# Patient Record
Sex: Female | Born: 1950
Health system: Southern US, Community
[De-identification: ages and names within clinical notes are randomized; demographics above are authoritative.]

## PROBLEM LIST (undated history)

## (undated) DIAGNOSIS — R112 Nausea with vomiting, unspecified: Secondary | ICD-10-CM

## (undated) DIAGNOSIS — E785 Hyperlipidemia, unspecified: Secondary | ICD-10-CM

## (undated) DIAGNOSIS — Z9889 Other specified postprocedural states: Secondary | ICD-10-CM

## (undated) DIAGNOSIS — H269 Unspecified cataract: Secondary | ICD-10-CM

## (undated) DIAGNOSIS — I10 Essential (primary) hypertension: Secondary | ICD-10-CM

## (undated) HISTORY — PX: TUBAL LIGATION: SHX77

## (undated) HISTORY — DX: Unspecified cataract: H26.9

## (undated) HISTORY — PX: BREAST LUMPECTOMY: SHX2

---

## 2002-04-01 ENCOUNTER — Other Ambulatory Visit: Admission: RE | Admit: 2002-04-01 | Discharge: 2002-04-01 | Payer: Self-pay | Admitting: Family Medicine

## 2003-06-04 ENCOUNTER — Other Ambulatory Visit: Admission: RE | Admit: 2003-06-04 | Discharge: 2003-06-04 | Payer: Self-pay | Admitting: Family Medicine

## 2014-10-28 ENCOUNTER — Ambulatory Visit (INDEPENDENT_AMBULATORY_CARE_PROVIDER_SITE_OTHER): Payer: Managed Care, Other (non HMO)

## 2014-10-28 ENCOUNTER — Other Ambulatory Visit: Payer: Self-pay | Admitting: *Deleted

## 2014-10-28 DIAGNOSIS — Z23 Encounter for immunization: Secondary | ICD-10-CM

## 2015-07-13 ENCOUNTER — Encounter: Payer: Self-pay | Admitting: Physician Assistant

## 2015-07-13 ENCOUNTER — Ambulatory Visit (INDEPENDENT_AMBULATORY_CARE_PROVIDER_SITE_OTHER): Payer: Medicare Other | Admitting: Physician Assistant

## 2015-07-13 VITALS — BP 142/70 | HR 69 | Temp 97.4°F | Wt 174.8 lb

## 2015-07-13 DIAGNOSIS — J069 Acute upper respiratory infection, unspecified: Secondary | ICD-10-CM

## 2015-07-13 MED ORDER — AMOXICILLIN-POT CLAVULANATE 875-125 MG PO TABS: 1.0000 | ORAL_TABLET | Freq: Two times a day (BID) | ORAL | Status: DC

## 2015-07-13 NOTE — Patient Instructions (Signed)
Upper Respiratory Infection, Adult An upper respiratory infection (URI) is also sometimes known as the common cold. The upper respiratory tract includes the nose, sinuses, throat, trachea, and bronchi. Bronchi are the airways leading to the lungs. Most people improve within 1 week, but symptoms can last up to 2 weeks. A residual cough may last even longer.  CAUSES Many different viruses can infect the tissues lining the upper respiratory tract. The tissues become irritated and inflamed and often become very moist. Mucus production is also common. A cold is contagious. You can easily spread the virus to others by oral contact. This includes kissing, sharing a glass, coughing, or sneezing. Touching your mouth or nose and then touching a surface, which is then touched by another person, can also spread the virus. SYMPTOMS  Symptoms typically develop 1 to 3 days after you come in contact with a cold virus. Symptoms vary from person to person. They may include:  Runny nose.  Sneezing.  Nasal congestion.  Sinus irritation.  Sore throat.  Loss of voice (laryngitis).  Cough.  Fatigue.  Muscle aches.  Loss of appetite.  Headache.  Low-grade fever. DIAGNOSIS  You might diagnose your own cold based on familiar symptoms, since most people get a cold 2 to 3 times a year. Your caregiver can confirm this based on your exam. Most importantly, your caregiver can check that your symptoms are not due to another disease such as strep throat, sinusitis, pneumonia, asthma, or epiglottitis. Blood tests, throat tests, and X-rays are not necessary to diagnose a common cold, but they may sometimes be helpful in excluding other more serious diseases. Your caregiver will decide if any further tests are required. RISKS AND COMPLICATIONS  You may be at risk for a more severe case of the common cold if you smoke cigarettes, have chronic heart disease (such as heart failure) or lung disease (such as asthma), or if  you have a weakened immune system. The very young and very old are also at risk for more serious infections. Bacterial sinusitis, middle ear infections, and bacterial pneumonia can complicate the common cold. The common cold can worsen asthma and chronic obstructive pulmonary disease (COPD). Sometimes, these complications can require emergency medical care and may be life-threatening. PREVENTION  The best way to protect against getting a cold is to practice good hygiene. Avoid oral or hand contact with people with cold symptoms. Wash your hands often if contact occurs. There is no clear evidence that vitamin C, vitamin E, echinacea, or exercise reduces the chance of developing a cold. However, it is always recommended to get plenty of rest and practice good nutrition. TREATMENT  Treatment is directed at relieving symptoms. There is no cure. Antibiotics are not effective, because the infection is caused by a virus, not by bacteria. Treatment may include:  Increased fluid intake. Sports drinks offer valuable electrolytes, sugars, and fluids.  Breathing heated mist or steam (vaporizer or shower).  Eating chicken soup or other clear broths, and maintaining good nutrition.  Getting plenty of rest.  Using gargles or lozenges for comfort.  Controlling fevers with ibuprofen or acetaminophen as directed by your caregiver.  Increasing usage of your inhaler if you have asthma. Zinc gel and zinc lozenges, taken in the first 24 hours of the common cold, can shorten the duration and lessen the severity of symptoms. Pain medicines may help with fever, muscle aches, and throat pain. A variety of non-prescription medicines are available to treat congestion and runny nose. Your caregiver   can make recommendations and may suggest nasal or lung inhalers for other symptoms.  HOME CARE INSTRUCTIONS   Only take over-the-counter or prescription medicines for pain, discomfort, or fever as directed by your  caregiver.  Use a warm mist humidifier or inhale steam from a shower to increase air moisture. This may keep secretions moist and make it easier to breathe.  Drink enough water and fluids to keep your urine clear or pale yellow.  Rest as needed.  Return to work when your temperature has returned to normal or as your caregiver advises. You may need to stay home longer to avoid infecting others. You can also use a face mask and careful hand washing to prevent spread of the virus. SEEK MEDICAL CARE IF:   After the first few days, you feel you are getting worse rather than better.  You need your caregiver's advice about medicines to control symptoms.  You develop chills, worsening shortness of breath, or brown or red sputum. These may be signs of pneumonia.  You develop yellow or brown nasal discharge or pain in the face, especially when you bend forward. These may be signs of sinusitis.  You develop a fever, swollen neck glands, pain with swallowing, or white areas in the back of your throat. These may be signs of strep throat. SEEK IMMEDIATE MEDICAL CARE IF:   You have a fever.  You develop severe or persistent headache, ear pain, sinus pain, or chest pain.  You develop wheezing, a prolonged cough, cough up blood, or have a change in your usual mucus (if you have chronic lung disease).  You develop sore muscles or a stiff neck. Document Released: 06/06/2001 Document Revised: 03/04/2012 Document Reviewed: 03/18/2014 [employer] Patient Information 2015 [employer]. This information is not intended to replace advice given to you by your health care provider. Make sure you discuss any questions you have with your health care provider.  

## 2015-07-13 NOTE — Progress Notes (Signed)
Subjective:     Patient ID: Brianna [name], female   DOB: [birth year], 64 y.o.   MRN: [id number]  HPI Pt with cough and congestion for 1 week Sx got better and then worse Cough is productive Using OTC cough med  Review of Systems  Constitutional: Negative for fever, activity change, appetite change and fatigue.  HENT: Positive for congestion, postnasal drip and sinus pressure.   Respiratory: Positive for cough and chest tightness. Negative for choking, shortness of breath and wheezing.   Cardiovascular: Negative.        Objective:   Physical Exam  Constitutional: She appears well-developed and well-nourished.  HENT:  Right Ear: External ear normal.  Left Ear: External ear normal.  Mouth/Throat: Oropharynx is clear and moist. No oropharyngeal exudate.  TM's nl bilat  Neck: Neck supple. No JVD present.  Cardiovascular: Normal rate, regular rhythm and normal heart sounds.   Pulmonary/Chest: Effort normal and breath sounds normal. No respiratory distress. She has no wheezes. She has no rales. She exhibits no tenderness.  Very tight and coarse with cough  Lymphadenopathy:    She has no cervical adenopathy.  Nursing note and vitals reviewed.      Assessment:     Acute URI    Plan:     Fluids  Rest Augmentin 875mg  bid x 10 days F/U prn

## 2015-12-03 ENCOUNTER — Encounter: Payer: Self-pay | Admitting: Pediatrics

## 2015-12-03 ENCOUNTER — Ambulatory Visit (INDEPENDENT_AMBULATORY_CARE_PROVIDER_SITE_OTHER): Payer: Medicare Other | Admitting: Pediatrics

## 2015-12-03 VITALS — BP 152/71 | HR 61 | Temp 97.5°F | Ht 65.0 in | Wt 175.0 lb

## 2015-12-03 DIAGNOSIS — J069 Acute upper respiratory infection, unspecified: Secondary | ICD-10-CM

## 2015-12-03 DIAGNOSIS — F17200 Nicotine dependence, unspecified, uncomplicated: Secondary | ICD-10-CM | POA: Diagnosis not present

## 2015-12-03 DIAGNOSIS — Z1389 Encounter for screening for other disorder: Secondary | ICD-10-CM

## 2015-12-03 DIAGNOSIS — R05 Cough: Secondary | ICD-10-CM

## 2015-12-03 DIAGNOSIS — Z23 Encounter for immunization: Secondary | ICD-10-CM | POA: Diagnosis not present

## 2015-12-03 DIAGNOSIS — R059 Cough, unspecified: Secondary | ICD-10-CM

## 2015-12-03 DIAGNOSIS — R062 Wheezing: Secondary | ICD-10-CM | POA: Diagnosis not present

## 2015-12-03 DIAGNOSIS — Z6829 Body mass index (BMI) 29.0-29.9, adult: Secondary | ICD-10-CM | POA: Insufficient documentation

## 2015-12-03 DIAGNOSIS — Z1331 Encounter for screening for depression: Secondary | ICD-10-CM

## 2015-12-03 DIAGNOSIS — E663 Overweight: Secondary | ICD-10-CM | POA: Insufficient documentation

## 2015-12-03 MED ORDER — GUAIFENESIN-CODEINE 100-10 MG/5ML PO SOLN: 5.0000 mL | Freq: Every evening | ORAL | Status: DC | PRN

## 2015-12-03 MED ORDER — SPACER/AERO CHAMBER MOUTHPIECE MISC: Status: DC

## 2015-12-03 MED ORDER — AZITHROMYCIN 250 MG PO TABS: ORAL_TABLET | ORAL | Status: DC

## 2015-12-03 MED ORDER — ALBUTEROL SULFATE HFA 108 (90 BASE) MCG/ACT IN AERS: 2.0000 | INHALATION_SPRAY | Freq: Four times a day (QID) | RESPIRATORY_TRACT | Status: DC | PRN

## 2015-12-03 NOTE — Patient Instructions (Signed)
Lots of fluids

## 2015-12-03 NOTE — Progress Notes (Addendum)
Subjective:    Patient ID: Brianna [name], female    DOB: 1951-01-21, 64 y.o.   MRN: [id number]  CC: Cough and Nasal Congestion   HPI: Brianna [name] is a 64 y.o. female presenting for Cough and Nasal Congestion  Lots of congestion, ongoing for over a week. Coughing a lot, worse at night Yesterday felt better, today a little worse No pressure in her sinuses Current smoker, about 1.5 ppd. Interested in quitting at some point, says not now. Gets more out of breath with usual activities No HA or vision changes No fevers Otherwise feeling well No weight changes    Depression screen [facility] 2/9 12/03/2015 07/13/2015  Decreased Interest 0 0  Down, Depressed, Hopeless 0 0  PHQ - 2 Score 0 0     Relevant past medical, surgical, family and social history reviewed and updated as indicated. Interim medical history since our last visit reviewed. Allergies and medications reviewed and updated.    ROS: Per HPI unless specifically indicated above  History  Smoking status  . Heavy Tobacco Smoker -- 1.50 packs/day  Smokeless tobacco  . Not on file    Past Medical History Patient Active Problem List   Diagnosis Date Noted  . Wheezing 12/03/2015  . Tobacco use disorder 12/03/2015  . BMI 29.0-29.9,adult 12/03/2015    Current Outpatient Prescriptions  Medication Sig Dispense Refill  . albuterol (PROVENTIL HFA;VENTOLIN HFA) 108 (90 BASE) MCG/ACT inhaler Inhale 2 puffs into the lungs every 6 (six) hours as needed for wheezing or shortness of breath. 1 Inhaler 0  . azithromycin (ZITHROMAX) 250 MG tablet Take 2 the first day and then one each day after. 6 tablet 0  . guaiFENesin-codeine 100-10 MG/5ML syrup Take 5 mLs by mouth at bedtime as needed for cough. 120 mL 0  . Spacer/Aero Chamber Mouthpiece MISC Please dispense one spacer for use with inhaler. 1 each 0   No current facility-administered medications for this visit.       Objective:    BP 152/71 mmHg  Pulse 61  Temp(Src) 97.5 F  (36.4 C) (Oral)  Ht 5\' 5"  (1.651 m)  Wt 175 lb (79.379 kg)  BMI 29.12 kg/m2  SpO2 96%  Wt Readings from Last 3 Encounters:  12/03/15 175 lb (79.379 kg)  07/13/15 174 lb 12.8 oz (79.289 kg)     Gen: NAD, alert, cooperative with exam, NCAT EYES: EOMI, no scleral injection or icterus ENT:  TMs dull gray b/l, OP without erythema LYMPH: no cervical LAD CV: NRRR, normal S1/S2, no murmur, distal pulses 2+ b/l Resp: faint wheezes b/l, moving air well, normal WOB Abd: +BS, soft, NTND. no guarding or organomegaly Ext: No edema, warm Neuro: Alert and oriented, strength equal b/l UE and LE, coordination grossly normal MSK: normal muscle bulk     Assessment & Plan:    Chamere was seen today for cough and nasal congestion, found to be wheezing, likely with COPD. Pt starting to feel better today, gave paper RX for azithro, fill if wheezing worsening, or not continuing to improve daily.  Diagnoses and all orders for this visit:  Wheezing [place] TID 2 puffs while coughing. Fill azithro if not continuing to improve. RTC 4 weeks for PFTs, discuss tobacco use. -     Spacer/Aero Chamber Mouthpiece MISC; Please dispense one spacer for use with inhaler. -     azithromycin (ZITHROMAX) 250 MG tablet; Take 2 the first day and then one each day after. -     albuterol (  PROVENTIL HFA;VENTOLIN HFA) 108 (90 BASE) MCG/ACT inhaler; Inhale 2 puffs into the lungs every 6 (six) hours as needed for wheezing or shortness of breath.  Coughing Take at ngiht as needed for cough -     guaiFENesin-codeine 100-10 MG/5ML syrup; Take 5 mLs by mouth at bedtime as needed for cough.  Acute URI Discussed symptomatic care  Need for prophylactic vaccination against Streptococcus pneumoniae (pneumococcus) -     Pneumococcal conjugate vaccine 13-valent  Encounter for immunization  Depression screen negative  Tobacco use disorder Pt pre-contemplative, not interested in discussing smoking cessation today. Will continue to  address at future visits.  Other orders -     Flu Vaccine QUAD 36+ mos IM  Elevated blood pressure Has been on cold medicines, RTC 4 weeks for recheck  Follow up plan: 4 weeks  Assunta Found, MD [facility] 12/03/2015, 11:15 AM

## 2015-12-03 NOTE — Addendum Note (Signed)
Addended by: Eustaquio Maize on: 12/03/2015 10:08 PM   Modules accepted: Level of Service

## 2015-12-08 ENCOUNTER — Telehealth: Payer: Self-pay

## 2015-12-08 MED ORDER — ALBUTEROL SULFATE HFA 108 (90 BASE) MCG/ACT IN AERS: 2.0000 | INHALATION_SPRAY | Freq: Four times a day (QID) | RESPIRATORY_TRACT | Status: DC | PRN

## 2015-12-08 NOTE — Telephone Encounter (Signed)
Insurance denied prior authorization for Proventil HFA Must try and fail Proair HFA or Proair Respiclick

## 2016-04-18 ENCOUNTER — Ambulatory Visit (INDEPENDENT_AMBULATORY_CARE_PROVIDER_SITE_OTHER): Payer: Medicare Other | Admitting: Family

## 2016-04-18 ENCOUNTER — Encounter: Payer: Self-pay | Admitting: Family

## 2016-04-18 VITALS — BP 170/79 | HR 72 | Temp 97.5°F | Ht 65.0 in | Wt 173.0 lb

## 2016-04-18 DIAGNOSIS — S46912A Strain of unspecified muscle, fascia and tendon at shoulder and upper arm level, left arm, initial encounter: Secondary | ICD-10-CM | POA: Diagnosis not present

## 2016-04-18 MED ORDER — NAPROXEN 500 MG PO TABS: 500.0000 mg | ORAL_TABLET | Freq: Two times a day (BID) | ORAL | Status: DC

## 2016-04-18 MED ORDER — CYCLOBENZAPRINE HCL 5 MG PO TABS: 5.0000 mg | ORAL_TABLET | Freq: Three times a day (TID) | ORAL | Status: DC | PRN

## 2016-04-18 MED ORDER — KETOROLAC TROMETHAMINE 60 MG/2ML IM SOLN
30.0000 mg | Freq: Once | INTRAMUSCULAR | Status: AC
  Administered 2016-04-18: 30 mg via INTRAMUSCULAR

## 2016-04-18 MED ORDER — METHYLPREDNISOLONE ACETATE 80 MG/ML IJ SUSP
80.0000 mg | Freq: Once | INTRAMUSCULAR | Status: AC
  Administered 2016-04-18: 80 mg via INTRAMUSCULAR

## 2016-04-18 NOTE — Progress Notes (Signed)
   Subjective:    Patient ID: Brianna [name], female    DOB: [birth year], 65 y.o.   MRN: [id number]  Shoulder Pain  The pain is present in the left shoulder and left arm. This is a new problem. The current episode started 1 to 4 weeks ago. There has been no history of extremity trauma. The problem occurs constantly. The problem has been unchanged. The quality of the pain is described as aching. The pain is at a severity of 7/10. The pain is moderate. Associated symptoms include numbness, stiffness and tingling. Pertinent negatives include no inability to bear weight. The symptoms are aggravated by activity. She has tried acetaminophen for the symptoms. The treatment provided mild relief. Family history does not include gout.      Review of Systems  Musculoskeletal: Positive for stiffness.  Neurological: Positive for tingling and numbness.  All other systems reviewed and are negative.      Objective:   Physical Exam  Constitutional: She is oriented to person, place, and time. She appears well-developed and well-nourished. No distress.  HENT:  Head: Normocephalic and atraumatic.  Eyes: Pupils are equal, round, and reactive to light.  Neck: Normal range of motion. Neck supple. No thyromegaly present.  Cardiovascular: Normal rate, regular rhythm, normal heart sounds and intact distal pulses.   No murmur heard. Pulmonary/Chest: Effort normal and breath sounds normal. No respiratory distress. She has no wheezes.  Abdominal: Soft. Bowel sounds are normal. She exhibits no distension. There is no tenderness.  Musculoskeletal: She exhibits no edema or tenderness.  Limited ROM of lifting her arm greater than 45 degrees related to pain    Neurological: She is alert and oriented to person, place, and time. No cranial nerve deficit.  Skin: Skin is warm and dry.  Psychiatric: She has a normal mood and affect. Her behavior is normal. Judgment and thought content normal.  Vitals reviewed.   Temp(Src)  97.5 F (36.4 C) (Oral)  Ht 5\' 5"  (1.651 m)  Wt 173 lb (78.472 kg)  BMI 28.79 kg/m2       Assessment & Plan:  1. Left shoulder strain, initial encounter -Rest -Ice or heat as needed -Sedation precautions discussed -RTO in 2 weeks to recheck  - methylPREDNISolone acetate (DEPO-MEDROL) injection 80 mg; Inject 1 mL (80 mg total) into the muscle once. - ketorolac (TORADOL) injection 30 mg; Inject 1 mL (30 mg total) into the muscle once. - naproxen (NAPROSYN) 500 MG tablet; Take 1 tablet (500 mg total) by mouth 2 (two) times daily with a meal.  Dispense: 60 tablet; Refill: 1 - cyclobenzaprine (FLEXERIL) 5 MG tablet; Take 1 tablet (5 mg total) by mouth 3 (three) times daily as needed for muscle spasms.  Dispense: 30 tablet; Refill: 0  Evelina Dun, FNP

## 2016-04-18 NOTE — Patient Instructions (Signed)
Shoulder Sprain °A shoulder sprain is a partial or complete tear in one of the tough, fiber-like tissues (ligaments) in the shoulder. The ligaments in the shoulder help to hold the shoulder in place. °CAUSES °This condition may be caused by: °· A fall. °· A hit to the shoulder. °· A twist of the arm. °RISK FACTORS °This condition is more likely to develop in: °· People who play sports. °· People who have problems with balance or coordination. °SYMPTOMS °Symptoms of this condition include: °· Pain when moving the shoulder. °· Limited ability to move the shoulder. °· Swelling and tenderness on top of the shoulder. °· Warmth in the shoulder. °· A change in the shape of the shoulder. °· Redness or bruising on the shoulder. °DIAGNOSIS °This condition is diagnosed with a physical exam. During the exam, you may be asked to do simple exercises with your shoulder. You may also have imaging tests, such as X-rays, MRI, or a CT scan. These tests can show how severe the sprain is. °TREATMENT °This condition may be treated with: °· Rest. °· Pain medicine. °· Ice. °· A sling or brace. This is used to keep the arm still while the shoulder is healing. °· Physical therapy or rehabilitation exercises. These help to improve the range of motion and strength of the shoulder. °· Surgery (rare). Surgery may be needed if the sprain caused a joint to become unstable. Surgery may also be needed to reduce pain. °Some people may develop ongoing shoulder pain or lose some range of motion in the shoulder. However, most people do not develop long-term problems. °HOME CARE INSTRUCTIONS °· Rest. °· Take over-the-counter and prescription medicines only as told by your health care provider. °· If directed, apply ice to the area: °¨ Put ice in a plastic bag. °¨ Place a towel between your skin and the bag. °¨ Leave the ice on for 20 minutes, 2-3 times per day. °· If you were given a shoulder sling or brace: °¨ Wear it as told. °¨ Remove it to shower or  bathe. °¨ Move your arm only as much as told by your health care provider, but keep your hand moving to prevent swelling. °· If you were shown how to do any exercises, do them as told by your health care provider. °· Keep all follow-up visits as told by your health care provider. This is important. °SEEK MEDICAL CARE IF: °· Your pain gets worse. °· Your pain is not relieved with medicines. °· You have increased redness or swelling. °SEEK IMMEDIATE MEDICAL CARE IF: °· You have a fever. °· You cannot move your arm or shoulder. °· You develop numbness or tingling in your arms, hands, or fingers. °  °This information is not intended to replace advice given to you by your health care provider. Make sure you discuss any questions you have with your health care provider. °  °Document Released: 04/29/2009 Document Revised: 09/01/2015 Document Reviewed: 04/05/2015 °[employer] Interactive Patient Education ©2016 [employer] ° °

## 2016-05-16 ENCOUNTER — Encounter: Payer: Self-pay | Admitting: Family

## 2016-05-16 ENCOUNTER — Ambulatory Visit (INDEPENDENT_AMBULATORY_CARE_PROVIDER_SITE_OTHER): Payer: Medicare Other

## 2016-05-16 ENCOUNTER — Encounter (INDEPENDENT_AMBULATORY_CARE_PROVIDER_SITE_OTHER): Payer: Self-pay

## 2016-05-16 ENCOUNTER — Ambulatory Visit (INDEPENDENT_AMBULATORY_CARE_PROVIDER_SITE_OTHER): Payer: Medicare Other | Admitting: Family

## 2016-05-16 VITALS — BP 136/60 | HR 71 | Temp 97.4°F | Ht 65.0 in | Wt 173.0 lb

## 2016-05-16 DIAGNOSIS — IMO0001 Reserved for inherently not codable concepts without codable children: Secondary | ICD-10-CM

## 2016-05-16 DIAGNOSIS — Z6829 Body mass index (BMI) 29.0-29.9, adult: Secondary | ICD-10-CM

## 2016-05-16 DIAGNOSIS — S46912D Strain of unspecified muscle, fascia and tendon at shoulder and upper arm level, left arm, subsequent encounter: Secondary | ICD-10-CM | POA: Diagnosis not present

## 2016-05-16 DIAGNOSIS — F17200 Nicotine dependence, unspecified, uncomplicated: Secondary | ICD-10-CM

## 2016-05-16 DIAGNOSIS — Z780 Asymptomatic menopausal state: Secondary | ICD-10-CM

## 2016-05-16 DIAGNOSIS — Z13220 Encounter for screening for lipoid disorders: Secondary | ICD-10-CM

## 2016-05-16 DIAGNOSIS — R030 Elevated blood-pressure reading, without diagnosis of hypertension: Secondary | ICD-10-CM | POA: Diagnosis not present

## 2016-05-16 DIAGNOSIS — Z136 Encounter for screening for cardiovascular disorders: Secondary | ICD-10-CM

## 2016-05-16 DIAGNOSIS — Z1239 Encounter for other screening for malignant neoplasm of breast: Secondary | ICD-10-CM | POA: Diagnosis not present

## 2016-05-16 DIAGNOSIS — Z1211 Encounter for screening for malignant neoplasm of colon: Secondary | ICD-10-CM | POA: Diagnosis not present

## 2016-05-16 DIAGNOSIS — Z1159 Encounter for screening for other viral diseases: Secondary | ICD-10-CM | POA: Diagnosis not present

## 2016-05-16 NOTE — Patient Instructions (Signed)
Health Maintenance, Female Adopting a healthy lifestyle and getting preventive care can go a long way to promote health and wellness. Talk with your health care provider about what schedule of regular examinations is right for you. This is a good chance for you to check in with your provider about disease prevention and staying healthy. In between checkups, there are plenty of things you can do on your own. Experts have done a lot of research about which lifestyle changes and preventive measures are most likely to keep you healthy. Ask your health care provider for more information. WEIGHT AND DIET  Eat a healthy diet  Be sure to include plenty of vegetables, fruits, low-fat dairy products, and lean protein.  Do not eat a lot of foods high in solid fats, added sugars, or salt.  Get regular exercise. This is one of the most important things you can do for your health.  Most adults should exercise for at least 150 minutes each week. The exercise should increase your heart rate and make you sweat (moderate-intensity exercise).  Most adults should also do strengthening exercises at least twice a week. This is in addition to the moderate-intensity exercise.  Maintain a healthy weight  Body mass index (BMI) is a measurement that can be used to identify possible weight problems. It estimates body fat based on height and weight. Your health care provider can help determine your BMI and help you achieve or maintain a healthy weight.  For females 20 years of age and older:   A BMI below 18.5 is considered underweight.  A BMI of 18.5 to 24.9 is normal.  A BMI of 25 to 29.9 is considered overweight.  A BMI of 30 and above is considered obese.  Watch levels of cholesterol and blood lipids  You should start having your blood tested for lipids and cholesterol at 66 years of age, then have this test every 5 years.  You may need to have your cholesterol levels checked more often if:  Your lipid  or cholesterol levels are high.  You are older than 66 years of age.  You are at high risk for heart disease.  CANCER SCREENING   Lung Cancer  Lung cancer screening is recommended for adults 55-80 years old who are at high risk for lung cancer because of a history of smoking.  A yearly low-dose CT scan of the lungs is recommended for people who:  Currently smoke.  Have quit within the past 15 years.  Have at least a 30-pack-year history of smoking. A pack year is smoking an average of one pack of cigarettes a day for 1 year.  Yearly screening should continue until it has been 15 years since you quit.  Yearly screening should stop if you develop a health problem that would prevent you from having lung cancer treatment.  Breast Cancer  Practice breast self-awareness. This means understanding how your breasts normally appear and feel.  It also means doing regular breast self-exams. Let your health care provider know about any changes, no matter how small.  If you are in your 20s or 30s, you should have a clinical breast exam (CBE) by a health care provider every 1-3 years as part of a regular health exam.  If you are 40 or older, have a CBE every year. Also consider having a breast X-ray (mammogram) every year.  If you have a family history of breast cancer, talk to your health care provider about genetic screening.  If you   are at high risk for breast cancer, talk to your health care provider about having an MRI and a mammogram every year.  Breast cancer gene (BRCA) assessment is recommended for women who have family members with BRCA-related cancers. BRCA-related cancers include:  Breast.  Ovarian.  Tubal.  Peritoneal cancers.  Results of the assessment will determine the need for genetic counseling and BRCA1 and BRCA2 testing. Cervical Cancer Your health care provider may recommend that you be screened regularly for cancer of the pelvic organs (ovaries, uterus, and  vagina). This screening involves a pelvic examination, including checking for microscopic changes to the surface of your cervix (Pap test). You may be encouraged to have this screening done every 3 years, beginning at age 21.  For women ages 30-65, health care providers may recommend pelvic exams and Pap testing every 3 years, or they may recommend the Pap and pelvic exam, combined with testing for human papilloma virus (HPV), every 5 years. Some types of HPV increase your risk of cervical cancer. Testing for HPV may also be done on women of any age with unclear Pap test results.  Other health care providers may not recommend any screening for nonpregnant women who are considered low risk for pelvic cancer and who do not have symptoms. Ask your health care provider if a screening pelvic exam is right for you.  If you have had past treatment for cervical cancer or a condition that could lead to cancer, you need Pap tests and screening for cancer for at least 20 years after your treatment. If Pap tests have been discontinued, your risk factors (such as having a new sexual partner) need to be reassessed to determine if screening should resume. Some women have medical problems that increase the chance of getting cervical cancer. In these cases, your health care provider may recommend more frequent screening and Pap tests. Colorectal Cancer  This type of cancer can be detected and often prevented.  Routine colorectal cancer screening usually begins at 66 years of age and continues through 66 years of age.  Your health care provider may recommend screening at an earlier age if you have risk factors for colon cancer.  Your health care provider may also recommend using home test kits to check for hidden blood in the stool.  A small camera at the end of a tube can be used to examine your colon directly (sigmoidoscopy or colonoscopy). This is done to check for the earliest forms of colorectal  cancer.  Routine screening usually begins at age 50.  Direct examination of the colon should be repeated every 5-10 years through 66 years of age. However, you may need to be screened more often if early forms of precancerous polyps or small growths are found. Skin Cancer  Check your skin from head to toe regularly.  Tell your health care provider about any new moles or changes in moles, especially if there is a change in a mole's shape or color.  Also tell your health care provider if you have a mole that is larger than the size of a pencil eraser.  Always use sunscreen. Apply sunscreen liberally and repeatedly throughout the day.  Protect yourself by wearing long sleeves, pants, a wide-brimmed hat, and sunglasses whenever you are outside. HEART DISEASE, DIABETES, AND HIGH BLOOD PRESSURE   High blood pressure causes heart disease and increases the risk of stroke. High blood pressure is more likely to develop in:  People who have blood pressure in the high end   of the normal range (130-139/85-89 mm Hg).  People who are overweight or obese.  People who are African American.  If you are 38-23 years of age, have your blood pressure checked every 3-5 years. If you are 61 years of age or older, have your blood pressure checked every year. You should have your blood pressure measured twice--once when you are at a hospital or clinic, and once when you are not at a hospital or clinic. Record the average of the two measurements. To check your blood pressure when you are not at a hospital or clinic, you can use:  An automated blood pressure machine at a pharmacy.  A home blood pressure monitor.  If you are between 45 years and 39 years old, ask your health care provider if you should take aspirin to prevent strokes.  Have regular diabetes screenings. This involves taking a blood sample to check your fasting blood sugar level.  If you are at a normal weight and have a low risk for diabetes,  have this test once every three years after 66 years of age.  If you are overweight and have a high risk for diabetes, consider being tested at a younger age or more often. PREVENTING INFECTION  Hepatitis B  If you have a higher risk for hepatitis B, you should be screened for this virus. You are considered at high risk for hepatitis B if:  You were born in a country where hepatitis B is common. Ask your health care provider which countries are considered high risk.  Your parents were born in a high-risk country, and you have not been immunized against hepatitis B (hepatitis B vaccine).  You have HIV or AIDS.  You use needles to inject street drugs.  You live with someone who has hepatitis B.  You have had sex with someone who has hepatitis B.  You get hemodialysis treatment.  You take certain medicines for conditions, including cancer, organ transplantation, and autoimmune conditions. Hepatitis C  Blood testing is recommended for:  Everyone born from [birth year] through 1966.  Anyone with known risk factors for hepatitis C. Sexually transmitted infections (STIs)  You should be screened for sexually transmitted infections (STIs) including gonorrhea and chlamydia if:  You are sexually active and are younger than 66 years of age.  You are older than 66 years of age and your health care provider tells you that you are at risk for this type of infection.  Your sexual activity has changed since you were last screened and you are at an increased risk for chlamydia or gonorrhea. Ask your health care provider if you are at risk.  If you do not have HIV, but are at risk, it may be recommended that you take a prescription medicine daily to prevent HIV infection. This is called pre-exposure prophylaxis (PrEP). You are considered at risk if:  You are sexually active and do not regularly use condoms or know the HIV status of your partner(s).  You take drugs by injection.  You are sexually  active with a partner who has HIV. Talk with your health care provider about whether you are at high risk of being infected with HIV. If you choose to begin PrEP, you should first be tested for HIV. You should then be tested every 3 months for as long as you are taking PrEP.  PREGNANCY   If you are premenopausal and you may become pregnant, ask your health care provider about preconception counseling.  If you may  become pregnant, take 400 to 800 micrograms (mcg) of folic acid every day.  If you want to prevent pregnancy, talk to your health care provider about birth control (contraception). OSTEOPOROSIS AND MENOPAUSE   Osteoporosis is a disease in which the bones lose minerals and strength with aging. This can result in serious bone fractures. Your risk for osteoporosis can be identified using a bone density scan.  If you are 61 years of age or older, or if you are at risk for osteoporosis and fractures, ask your health care provider if you should be screened.  Ask your health care provider whether you should take a calcium or vitamin D supplement to lower your risk for osteoporosis.  Menopause may have certain physical symptoms and risks.  Hormone replacement therapy may reduce some of these symptoms and risks. Talk to your health care provider about whether hormone replacement therapy is right for you.  HOME CARE INSTRUCTIONS   Schedule regular health, dental, and eye exams.  Stay current with your immunizations.   Do not use any tobacco products including cigarettes, chewing tobacco, or electronic cigarettes.  If you are pregnant, do not drink alcohol.  If you are breastfeeding, limit how much and how often you drink alcohol.  Limit alcohol intake to no more than 1 drink per day for nonpregnant women. One drink equals 12 ounces of beer, 5 ounces of wine, or 1 ounces of hard liquor.  Do not use street drugs.  Do not share needles.  Ask your health care provider for help if  you need support or information about quitting drugs.  Tell your health care provider if you often feel depressed.  Tell your health care provider if you have ever been abused or do not feel safe at home.   This information is not intended to replace advice given to you by your health care provider. Make sure you discuss any questions you have with your health care provider.   Document Released: 06/26/2011 Document Revised: 01/01/2015 Document Reviewed: 11/12/2013 Elsevier Interactive Patient Education [employer].

## 2016-05-16 NOTE — Progress Notes (Signed)
   Subjective:    Patient ID: Brianna [name], female    DOB: [birth year], 65 y.o.   MRN: [id number]  HPI PT presents to the office today to recheck left shoulder strain and elevated BP. PT was stared on naproxen and flexeril and states the pain has greatly improved. PT states she has intermittent pain of 3 out 10 and has much better ROM. PT's BP is WNL at this time. Pt states it has been years since her last physical and is behind on all of her health maintenance.     Review of Systems  Constitutional: Negative.   HENT: Negative.   Eyes: Negative.   Respiratory: Negative.  Negative for shortness of breath.   Cardiovascular: Negative.  Negative for palpitations.  Gastrointestinal: Negative.   Endocrine: Negative.   Genitourinary: Negative.   Musculoskeletal: Negative.   Neurological: Negative.  Negative for headaches.  Hematological: Negative.   Psychiatric/Behavioral: Negative.   All other systems reviewed and are negative.      Objective:   Physical Exam  Constitutional: She is oriented to person, place, and time. She appears well-developed and well-nourished. No distress.  HENT:  Head: Normocephalic and atraumatic.  Right Ear: External ear normal.  Mouth/Throat: Oropharynx is clear and moist.  Eyes: Pupils are equal, round, and reactive to light.  Neck: Normal range of motion. Neck supple. No thyromegaly present.  Cardiovascular: Normal rate, regular rhythm, normal heart sounds and intact distal pulses.   No murmur heard. Pulmonary/Chest: Effort normal and breath sounds normal. No respiratory distress. She has no wheezes.  Hoarse voice, diminished breath sounds   Abdominal: Soft. Bowel sounds are normal. She exhibits no distension. There is no tenderness.  Musculoskeletal: Normal range of motion. She exhibits no edema or tenderness.  Neurological: She is alert and oriented to person, place, and time. She has normal reflexes. No cranial nerve deficit.  Skin: Skin is warm and dry.   Psychiatric: She has a normal mood and affect. Her behavior is normal. Judgment and thought content normal.  Vitals reviewed.     BP 136/60 mmHg  Pulse 71  Temp(Src) 97.4 F (36.3 C) (Oral)  Ht '5\' 5"'$  (1.651 m)  Wt 173 lb (78.472 kg)  BMI 28.79 kg/m2     Assessment & Plan:  1. Left shoulder strain, subsequent encounter - CMP14+EGFR  2. BMI 29.0-29.9,adult - CMP14+EGFR  3. Tobacco use disorder - CMP14+EGFR  4. Breast cancer screening - [facility] - CMP14+EGFR  5. Encounter for cholesteral screening for cardiovascular disease - CMP14+EGFR - Lipid panel  6. Need for hepatitis C screening test - CMP14+EGFR - Hepatitis C Antibody  7. Colon cancer screening - Fecal occult blood, imunochemical; Future - CMP14+EGFR  8. Post-menopausal - DG [facility] DEXA - CMP14+EGFR   Continue all meds Labs pending Health Maintenance reviewed Diet and exercise encouraged RTO 6 months  Brianna Dun, FNP

## 2016-05-17 LAB — CMP14+EGFR
ALBUMIN: 4.4 g/dL (ref 3.6–4.8)
ALK PHOS: 84 IU/L (ref 39–117)
ALT: 20 IU/L (ref 0–32)
AST: 17 IU/L (ref 0–40)
Albumin/Globulin Ratio: 2 (ref 1.2–2.2)
BUN/Creatinine Ratio: 22 (ref 12–28)
BUN: 16 mg/dL (ref 8–27)
Bilirubin Total: 0.5 mg/dL (ref 0.0–1.2)
CO2: 26 mmol/L (ref 18–29)
CREATININE: 0.72 mg/dL (ref 0.57–1.00)
Calcium: 9.8 mg/dL (ref 8.7–10.3)
Chloride: 99 mmol/L (ref 96–106)
GFR calc Af Amer: 101 mL/min/{1.73_m2} (ref >59)
GFR calc non Af Amer: 88 mL/min/{1.73_m2} (ref >59)
GLUCOSE: 102 mg/dL — AB (ref 65–99)
Globulin, Total: 2.2 g/dL (ref 1.5–4.5)
Potassium: 5.4 mmol/L — ABNORMAL HIGH (ref 3.5–5.2)
Sodium: 140 mmol/L (ref 134–144)
Total Protein: 6.6 g/dL (ref 6.0–8.5)

## 2016-05-17 LAB — HEPATITIS C ANTIBODY: Hep C Virus Ab: <0.1 s/co ratio (ref 0.0–0.9)

## 2016-05-17 LAB — LIPID PANEL
CHOLESTEROL TOTAL: 193 mg/dL (ref 100–199)
Chol/HDL Ratio: 2.1 ratio units (ref 0.0–4.4)
HDL: 90 mg/dL (ref >39)
LDL CALC: 81 mg/dL (ref 0–99)
TRIGLYCERIDES: 112 mg/dL (ref 0–149)
VLDL CHOLESTEROL CAL: 22 mg/dL (ref 5–40)

## 2016-05-25 ENCOUNTER — Ambulatory Visit: Payer: Medicare Other | Admitting: Pharmacist

## 2016-06-07 ENCOUNTER — Ambulatory Visit (INDEPENDENT_AMBULATORY_CARE_PROVIDER_SITE_OTHER): Payer: Medicare Other | Admitting: Pharmacist

## 2016-06-07 ENCOUNTER — Encounter: Payer: Self-pay | Admitting: Pharmacist

## 2016-06-07 VITALS — BP 134/68 | HR 70 | Ht 65.0 in | Wt 173.0 lb

## 2016-06-07 DIAGNOSIS — M8580 Other specified disorders of bone density and structure, unspecified site: Secondary | ICD-10-CM | POA: Diagnosis not present

## 2016-06-07 DIAGNOSIS — Z1239 Encounter for other screening for malignant neoplasm of breast: Secondary | ICD-10-CM | POA: Diagnosis not present

## 2016-06-07 MED ORDER — CALCIUM CARBONATE-VITAMIN D 600-400 MG-UNIT PO TABS: 1.0000 | ORAL_TABLET | Freq: Every day | ORAL | Status: AC

## 2016-06-07 NOTE — Patient Instructions (Signed)
Fall Prevention in the Home  Falls can cause injuries and can affect people from all age groups. There are many simple things that you can do to make your home safe and to help prevent falls. WHAT CAN I DO ON THE OUTSIDE OF MY HOME?  Regularly repair the edges of walkways and driveways and fix any cracks.  Remove high doorway thresholds.  Trim any shrubbery on the main path into your home.  Use bright outdoor lighting.  Clear walkways of debris and clutter, including tools and rocks.  Regularly check that handrails are securely fastened and in good repair. Both sides of any steps should have handrails.  Install guardrails along the edges of any raised decks or porches.  Have leaves, snow, and ice cleared regularly.  Use sand or salt on walkways during winter months.  In the garage, clean up any spills right away, including grease or oil spills. WHAT CAN I DO IN THE BATHROOM?  Use night lights.  Install grab bars by the toilet and in the tub and shower. Do not use towel bars as grab bars.  Use non-skid mats or decals on the floor of the tub or shower.  If you need to sit down while you are in the shower, use a plastic, non-slip stool..  Keep the floor dry. Immediately clean up any water that spills on the floor.  Remove soap buildup in the tub or shower on a regular basis.  Attach bath mats securely with double-sided non-slip rug tape.  Remove throw rugs and other tripping hazards from the floor. WHAT CAN I DO IN THE BEDROOM?  Use night lights.  Make sure that a bedside light is easy to reach.  Do not use oversized bedding that drapes onto the floor.  Have a firm chair that has side arms to use for getting dressed.  Remove throw rugs and other tripping hazards from the floor. WHAT CAN I DO IN THE KITCHEN?   Clean up any spills right away.  Avoid walking on wet floors.  Place frequently used items in easy-to-reach places.  If you need to reach for something  above you, use a sturdy step stool that has a grab bar.  Keep electrical cables out of the way.  Do not use floor polish or wax that makes floors slippery. If you have to use wax, make sure that it is non-skid floor wax.  Remove throw rugs and other tripping hazards from the floor. WHAT CAN I DO IN THE STAIRWAYS?  Do not leave any items on the stairs.  Make sure that there are handrails on both sides of the stairs. Fix handrails that are broken or loose. Make sure that handrails are as long as the stairways.  Check any carpeting to make sure that it is firmly attached to the stairs. Fix any carpet that is loose or worn.  Avoid having throw rugs at the top or bottom of stairways, or secure the rugs with carpet tape to prevent them from moving.  Make sure that you have a light switch at the top of the stairs and the bottom of the stairs. If you do not have them, have them installed. WHAT ARE SOME OTHER FALL PREVENTION TIPS?  Wear closed-toe shoes that fit well and support your feet. Wear shoes that have rubber soles or low heels.  When you use a stepladder, make sure that it is completely opened and that the sides are firmly locked. Have someone hold the ladder while you   are using it. Do not climb a closed stepladder.  Add color or contrast paint or tape to grab bars and handrails in your home. Place contrasting color strips on the first and last steps.  Use mobility aids as needed, such as canes, walkers, scooters, and crutches.  Turn on lights if it is dark. Replace any light bulbs that burn out.  Set up furniture so that there are clear paths. Keep the furniture in the same spot.  Fix any uneven floor surfaces.  Choose a carpet design that does not hide the edge of steps of a stairway.  Be aware of any and all pets.  Review your medicines with your healthcare provider. Some medicines can cause dizziness or changes in blood pressure, which increase your risk of falling. Talk  with your health care provider about other ways that you can decrease your risk of falls. This may include working with a physical therapist or trainer to improve your strength, balance, and endurance.   This information is not intended to replace advice given to you by your health care provider. Make sure you discuss any questions you have with your health care provider.   Document Released: 12/01/2002 Document Revised: 04/27/2015 Document Reviewed: 01/15/2015 Elsevier Interactive Patient Education 2016 [employer]   Calcium & Vitamin D: The Facts  Why is calcium and vitamin D consumption important? Calcium: . Most Americans do not consume adequate amounts of calcium! Calcium is required for proper muscle function, nerve communication, bone support, and many other functions in the body.  . The body uses bones as a source of calcium. Bones 'remodel' themselves continuously - the body constantly breaks bone down to release calcium and rebuilds bones by replacing calcium in the bone later.  . As we get older, the rate of bone breakdown occurs faster than bone rebuilding which could lead to osteopenia, osteoporosis, and possible fractures.   Vitamin D: . People naturally make vitamin D in the body when sunlight hits the skin and triggers a process that leads to vitamin D production. This natural vitamin D production requires about 10-15 minutes of sun exposure on the hands, arms, and face at least 2-3 times per week. However, due to decreased sun exposure and the use of sunscreen, most people will need to get additional vitamin D from foods or supplements. Your doctor can measure your body's vitamin D level through a simple blood test to determine your daily vitamin D needs.  . Vitamin D is used to help the body absorb calcium, maintain bone health, help the immune system, and reduce inflammation. It also plays a role in muscle performance, balance and risk of falling.  . Vitamin D deficiency can  lead to osteomalacia or softening of the bones, bone pain, and muscle weakness.   The recommended daily allowance of Calcium and Vitamin D varies for different age groups. Age group Calcium (mg) Vitamin D (IU)  Females and Males: Age 19-50 1000 mg 600 IU  Females: Age 51- 70 1200 mg 600 IU  Males: Age 51-70 1000 mg 600 IU  Females and Males: Age 71+ 1200 mg 800 IU  Pregnant/lactating Females age 19-50 1000 mg 600 IU   How much Calcium do you get in your diet? Calcium Intake # of servings per day  Total calcium (mg)  Skim milk, 2% milk (1 cup) _________ x 300 mg   Yogurt (1 small container) _________ x 200 mg   Cheese (1oz) _________ x 200 mg   Cottage Cheese (  1 cup)             ________ x 150 mg   Almond milk (1 cup) _________ x 450 mg   Fortified Orange Juice (1 cup) _________ x 300 mg   Broccoli or spinach ( 1 cup) _________ x 100 mg   Salmon (3 oz) _________ x 150 mg    Almonds (1/4 cup) _______ x 90 mg      How do we get Calcium and Vitamin D in our diet? Calcium: . Obtaining calcium from the diet is the most preferred way to reach the recommended daily goal. If this goal is not reached through diet, calcium supplements are available.  . Calcium is found in many foods including: dairy products, dark leafy vegetables (like broccoli, kale, and spinach), fish, and fortified products like juices and cereals.  . The food label will have a %DV (percent daily value) listed showing the amount of calcium per serving. To determine the total mg per serving, simply replace the % with zero (0).  For example, Almond Breeze almond milk contains 45% DV of calcium or 450mg per 1 cup.  . You can increase the amount of calcium in your diet by using more calcium products in your daily meals. Use yogurt and fruit to make smoothies or use yogurt to top baked potatoes or make whipped potatoes. Sprinkle low fat cheese onto salads or into egg white omelets. You can even add non-fat dry milk powder (300mg  calcium per 1/3 cup) to hot cereals, meat loaf, soups, or potatoes.  . Calcium supplements come in many forms including tablets, chewables, and gummies. Be sure to read the label to determine the correct number of tablets per serving and whether or not to take the supplement with food.  . Calcium carbonate products (Oscal, Caltrate, and Viactiv) are generally better absorbed when taken with food while calcium citrate products like Citracal can be taken with or without food.  . The body can only absorb about 600 mg of calcium at one time. It is recommended to take calcium supplements in small amounts several times per day.  However, taking it all at once is better than not taking it at all. . Increasing your intake of calcium is essential for bone health, but may also lead to some side effects like constipation, increased gas, bloating or abdominal cramping. To help reduce these side effects, start with 1 tablet per day and slowly increase your intake of the supplement to the recommended doses. It is also recommended that you drink plenty of water each day. Vitamin D: . Very few foods naturally contain vitamin D. However, it is found in saltwater fish (like tuna, salmon and mackerel), beef liver, egg yolks, cheese and vitamin D fortified foods (like yogurt, cereals, orange juice and milk) . The amount of vitamin D in each food or product is listed as %DV on the product label. To determine the total amount of vitamin D per serving, drop the % sign and multiply the number by 4. For example, 1 cup of Almond Breeze almond milk contains 25% DV vitamin D or 100 IU per serving (25 x 4 =100). . Vitamin D is also found in multivitamins and supplements and may be listed as ergocalciferol (vitamin D2) or cholecalciferol (vitamin D3). Each of these forms of vitamin D are equivalent and the daily recommended intake will vary based on your age and the vitamin D levels in your body. Follow your doctor's recommendation for  vitamin D   intake.        

## 2016-06-07 NOTE — Progress Notes (Signed)
Patient ID: Brianna [name], female   DOB: February 12, 1951, 65 y.o.   MRN: [id number]  [facility] Current Height: Height: 5\' 5"  (165.1 cm)      Max Lifetime Height:  5' 5.5" Current Weight: Weight: 173 lb (78.472 kg)       Ethnicity:Caucasian  BP: BP: 134/68 mmHg     HR:  Pulse Rate: 70      HPI: Does pt already have a diagnosis of:  Osteopenia?  No Osteoporosis?  No  Back Pain?  No       Kyphosis?  No Prior fracture?  No Med(s) for Osteoporosis/Osteopenia:  NONE Med(s) previously tried for Osteoporosis/Osteopenia:  NONE                                                             PMH: Age at menopause:  83's Hysterectomy?  No Oophorectomy?  No HRT? No Steroid Use?  No Thyroid med?  No History of cancer?  No History of digestive disorders (ie Crohn's)?  No Current or previous eating disorders?  No Last Vitamin D Result:  Never checked Last GFR Result:  88 (05/16/2016)   FH/SH: Family history of osteoporosis?  Yes - sister and mother Parent with history of hip fracture?  No Family history of breast cancer?  Yes - sister Exercise?  Yes - walks dog Smoking?  Yes - 1 and 1/2 ppd for 46 years Alcohol?  Yes occ use    Calcium Assessment Calcium Intake  # of servings/day  Calcium mg  Milk (8 oz) 0.5  x  300  = 150mg   Yogurt (4 oz) 0 x  200 = 0  Cheese (1 oz) 1 x  200 = 200mg   Other Calcium sources   250mg   Ca supplement 0 = 0   Estimated calcium intake per day 600mg     DEXA Results Date of Test T-Score for AP Spine L1-L4 T-Score for Total Left Hip T-Score of Neck of Left Hip T-Score for Total Right Hip T-Score for Neck of Right Hip  05/16/2016 -0.3 -0.5 -2.2 -0.7 -1.9                        FRAX 10 year estimate: Total FX risk:  12%  (consider medication if >/= 20%) Hip FX risk:  3.5%  (consider medication if >/= 3%)  Assessment: Osteopenia with high risk for fracture per FRAX  Recommendations: 1.  Discussed pharmacotherapy option.  Patient declined.   2.   recommend calcium 1200mg  daily through supplementation or diet.  3.  recommend weight bearing exercise - 30 minutes at least 4 days per week.   4.  Counseled and educated about fall risk and prevention. 5.  Discussed smoking cessation - discussed pharmacotherapy and patient declined.  Discussed trigger avoidiance and reducing amount she smokes.   6.  Mammogram order sent (patient goes to [facility])  Recheck DEXA:  2 years  Time spent counseling patient:  35 minutes

## 2016-09-23 ENCOUNTER — Ambulatory Visit (INDEPENDENT_AMBULATORY_CARE_PROVIDER_SITE_OTHER): Payer: Medicare Other

## 2016-09-23 DIAGNOSIS — Z23 Encounter for immunization: Secondary | ICD-10-CM | POA: Diagnosis not present

## 2016-10-02 ENCOUNTER — Ambulatory Visit (INDEPENDENT_AMBULATORY_CARE_PROVIDER_SITE_OTHER): Payer: Medicare Other | Admitting: Family Medicine

## 2016-10-02 ENCOUNTER — Encounter: Payer: Self-pay | Admitting: Family Medicine

## 2016-10-02 VITALS — BP 155/66 | HR 68 | Temp 97.9°F | Ht 65.0 in | Wt 176.1 lb

## 2016-10-02 DIAGNOSIS — J441 Chronic obstructive pulmonary disease with (acute) exacerbation: Secondary | ICD-10-CM | POA: Diagnosis not present

## 2016-10-02 MED ORDER — AMOXICILLIN-POT CLAVULANATE 875-125 MG PO TABS: 1.0000 | ORAL_TABLET | Freq: Two times a day (BID) | ORAL | 0 refills | Status: DC

## 2016-10-02 MED ORDER — HYDROCODONE-HOMATROPINE 5-1.5 MG/5ML PO SYRP: 5.0000 mL | ORAL_SOLUTION | Freq: Four times a day (QID) | ORAL | 0 refills | Status: DC | PRN

## 2016-10-02 NOTE — Progress Notes (Signed)
Subjective:  Patient ID: Brianna [name], female    DOB: 17-Oct-1951  Age: 65 y.o. MRN: [id number]  CC: Cough (pt here today c/o cough and it keeps her awake at night)   HPI Brianna [name] presents for  Increasing cough since onset 8 days ago. This occurred one day after getting a high-dose flu shot. Her symptoms are primarily upper respiratory congestion that has gotten better. This included some sinus pressure. She's not had rhinorrhea. She has a cough that feels loose somewhat that she is not able to get anything up from it. She has a baseline shortness of breath due to her long-term history of smoking. As reported below 69 pack years.   History Karn has a past medical history of Cataract.   She has a past surgical history that includes Breast lumpectomy (Left, 90's) and Tubal ligation.   Her family history includes Cancer in her brother, father, sister, and sister; Diabetes in her brother, brother, father, and sister; Heart disease in her mother; Hypertension in her mother; Osteoporosis in her mother and sister.She reports that she has been smoking.  She has a 69.00 pack-year smoking history. She has never used smokeless tobacco. She reports that she drinks alcohol. She reports that she does not use drugs.    ROS Review of Systems  Constitutional: Negative for activity change, appetite change, chills and fever.  HENT: Positive for congestion, postnasal drip, rhinorrhea and sinus pressure. Negative for ear discharge, ear pain, hearing loss, nosebleeds, sneezing and trouble swallowing.   Respiratory: Negative for chest tightness and shortness of breath.   Cardiovascular: Negative for chest pain and palpitations.  Skin: Negative for rash.    Objective:  BP (!) 155/66   Pulse 68   Temp 97.9 F (36.6 C) (Oral)   Ht 5\' 5"  (1.651 m)   Wt 176 lb 2 oz (79.9 kg)   BMI 29.31 kg/m   BP Readings from Last 3 Encounters:  10/02/16 (!) 155/66  06/07/16 134/68  05/16/16 136/60    Wt Readings  from Last 3 Encounters:  10/02/16 176 lb 2 oz (79.9 kg)  06/07/16 173 lb (78.5 kg)  05/16/16 173 lb (78.5 kg)     Physical Exam  Constitutional: She appears well-developed and well-nourished.  HENT:  Head: Normocephalic and atraumatic.  Right Ear: Tympanic membrane and external ear normal. No decreased hearing is noted.  Left Ear: Tympanic membrane and external ear normal. No decreased hearing is noted.  Nose: Mucosal edema present. Right sinus exhibits no frontal sinus tenderness. Left sinus exhibits no frontal sinus tenderness.  Mouth/Throat: No oropharyngeal exudate or posterior oropharyngeal erythema.  Neck: No Brudzinski's sign noted.  Pulmonary/Chest: Breath sounds normal. No respiratory distress.  Lymphadenopathy:       Head (right side): No preauricular adenopathy present.       Head (left side): No preauricular adenopathy present.       Right cervical: No superficial cervical adenopathy present.      Left cervical: No superficial cervical adenopathy present.     Lab Results  Component Value Date   GLUCOSE 102 (H) 05/16/2016   CHOL 193 05/16/2016   TRIG 112 05/16/2016   HDL 90 05/16/2016   LDLCALC 81 05/16/2016   ALT 20 05/16/2016   AST 17 05/16/2016   NA 140 05/16/2016   K 5.4 (H) 05/16/2016   CL 99 05/16/2016   CREATININE 0.72 05/16/2016   BUN 16 05/16/2016   CO2 26 05/16/2016    No results found.  Assessment & Plan:   Avalia was seen today for cough.  Diagnoses and all orders for this visit:  Acute exacerbation of chronic obstructive pulmonary disease (COPD) ([place])  Other orders -     amoxicillin-clavulanate (AUGMENTIN) 875-125 MG tablet; Take 1 tablet by mouth 2 (two) times daily. Take all of this medication -     HYDROcodone-homatropine (HYCODAN) 5-1.5 MG/5ML syrup; Take 5 mLs by mouth every 6 (six) hours as needed for cough.     I have discontinued Ms. Clear's naproxen and cyclobenzaprine. I am also having her start on amoxicillin-clavulanate and  HYDROcodone-homatropine. Additionally, I am having her maintain her Calcium Carbonate-Vitamin D.  Meds ordered this encounter  Medications  . amoxicillin-clavulanate (AUGMENTIN) 875-125 MG tablet    Sig: Take 1 tablet by mouth 2 (two) times daily. Take all of this medication    Dispense:  20 tablet    Refill:  0  . HYDROcodone-homatropine (HYCODAN) 5-1.5 MG/5ML syrup    Sig: Take 5 mLs by mouth every 6 (six) hours as needed for cough.    Dispense:  120 mL    Refill:  0     Follow-up: Return if symptoms worsen or fail to improve.  Claretta Fraise, M.D.

## 2018-01-29 ENCOUNTER — Ambulatory Visit (INDEPENDENT_AMBULATORY_CARE_PROVIDER_SITE_OTHER): Payer: Medicare Other | Admitting: Physician Assistant

## 2018-01-29 ENCOUNTER — Encounter: Payer: Self-pay | Admitting: Physician Assistant

## 2018-01-29 VITALS — BP 157/70 | HR 69 | Temp 97.4°F | Ht 65.0 in | Wt 172.8 lb

## 2018-01-29 DIAGNOSIS — J0110 Acute frontal sinusitis, unspecified: Secondary | ICD-10-CM | POA: Diagnosis not present

## 2018-01-29 MED ORDER — HYDROCODONE-HOMATROPINE 5-1.5 MG/5ML PO SYRP: 5.0000 mL | ORAL_SOLUTION | Freq: Four times a day (QID) | ORAL | 0 refills | Status: DC | PRN

## 2018-01-29 MED ORDER — AMOXICILLIN-POT CLAVULANATE 875-125 MG PO TABS: 1.0000 | ORAL_TABLET | Freq: Two times a day (BID) | ORAL | 0 refills | Status: DC

## 2018-01-29 NOTE — Progress Notes (Signed)
BP (!) 157/70   Pulse 69   Temp (!) 97.4 F (36.3 C) (Oral)   Ht 5\' 5"  (1.651 m)   Wt 172 lb 12.8 oz (78.4 kg)   BMI 28.76 kg/m    Subjective:    Patient ID: Brianna [name], female    DOB: 1951-11-26, 67 y.o.   MRN: [id number]  HPI: Marticia Reifschneider is a 67 y.o. female presenting on 01/29/2018 for head congestion and chest congestion  This patient has had many days of sinus headache and postnasal drainage. There is copious drainage at times. Denies any fever at this time. There has been a history of sinus infections in the past.  No history of sinus surgery. There is cough at night. It has become more prevalent in recent days.   Past Medical History:  Diagnosis Date  . Cataract    Relevant past medical, surgical, family and social history reviewed and updated as indicated. Interim medical history since our last visit reviewed. Allergies and medications reviewed and updated. DATA REVIEWED: CHART IN EPIC  Family History reviewed for pertinent findings.  Review of Systems  Constitutional: Positive for chills and fatigue. Negative for activity change, appetite change and fever.  HENT: Positive for congestion, postnasal drip, sinus pressure, sinus pain and sore throat.   Eyes: Negative.   Respiratory: Positive for cough. Negative for wheezing.   Cardiovascular: Negative.  Negative for chest pain, palpitations and leg swelling.  Gastrointestinal: Negative.   Genitourinary: Negative.   Musculoskeletal: Negative.   Skin: Negative.   Neurological: Positive for headaches.    Allergies as of 01/29/2018   No Known Allergies     Medication List        Accurate as of 01/29/18 12:26 PM. Always use your most recent med list.          amoxicillin-clavulanate 875-125 MG tablet Commonly known as:  AUGMENTIN Take 1 tablet by mouth 2 (two) times daily. Take all of this medication   Calcium Carbonate-Vitamin D 600-400 MG-UNIT tablet Commonly known as:  CALCIUM 600+D Take 1 tablet by mouth  daily.   HYDROcodone-homatropine 5-1.5 MG/5ML syrup Commonly known as:  HYCODAN Take 5 mLs by mouth every 6 (six) hours as needed for cough.          Objective:    BP (!) 157/70   Pulse 69   Temp (!) 97.4 F (36.3 C) (Oral)   Ht 5\' 5"  (1.651 m)   Wt 172 lb 12.8 oz (78.4 kg)   BMI 28.76 kg/m   No Known Allergies  Wt Readings from Last 3 Encounters:  01/29/18 172 lb 12.8 oz (78.4 kg)  10/02/16 176 lb 2 oz (79.9 kg)  06/07/16 173 lb (78.5 kg)    Physical Exam  Constitutional: She is oriented to person, place, and time. She appears well-developed and well-nourished.  HENT:  Head: Normocephalic and atraumatic.  Right Ear: Tympanic membrane and external ear normal. No middle ear effusion.  Left Ear: Tympanic membrane and external ear normal.  No middle ear effusion.  Nose: Mucosal edema and rhinorrhea present. Right sinus exhibits no maxillary sinus tenderness. Left sinus exhibits no maxillary sinus tenderness.  Mouth/Throat: Uvula is midline. Posterior oropharyngeal erythema present.  Eyes: Conjunctivae and EOM are normal. Pupils are equal, round, and reactive to light. Right eye exhibits no discharge. Left eye exhibits no discharge.  Neck: Normal range of motion.  Cardiovascular: Normal rate, regular rhythm and normal heart sounds.  Pulmonary/Chest: Effort normal and breath sounds normal. No  respiratory distress. She has no wheezes.  Abdominal: Soft.  Lymphadenopathy:    She has no cervical adenopathy.  Neurological: She is alert and oriented to person, place, and time.  Skin: Skin is warm and dry.  Psychiatric: She has a normal mood and affect.        Assessment & Plan:   1. Acute non-recurrent frontal sinusitis - amoxicillin-clavulanate (AUGMENTIN) 875-125 MG tablet; Take 1 tablet by mouth 2 (two) times daily. Take all of this medication  Dispense: 20 tablet; Refill: 0 - HYDROcodone-homatropine (HYCODAN) 5-1.5 MG/5ML syrup; Take 5 mLs by mouth every 6 (six) hours as  needed for cough.  Dispense: 120 mL; Refill: 0   Continue all other maintenance medications as listed above.  Follow up plan: No Follow-up on file.  Educational handout given for [place] PA-C [place] [address]  [place], [place] [address] [phone]   01/29/2018, 12:26 PM

## 2018-02-20 ENCOUNTER — Ambulatory Visit (INDEPENDENT_AMBULATORY_CARE_PROVIDER_SITE_OTHER): Payer: Medicare Other

## 2018-02-20 DIAGNOSIS — Z23 Encounter for immunization: Secondary | ICD-10-CM | POA: Diagnosis not present

## 2018-02-20 NOTE — Progress Notes (Signed)
Pt given Preumovax and flu vaccine Tolerated well

## 2018-02-20 NOTE — Addendum Note (Signed)
Addended by: Marin Olp on: 02/20/2018 12:35 PM   Modules accepted: Orders

## 2018-04-18 DIAGNOSIS — H52223 Regular astigmatism, bilateral: Secondary | ICD-10-CM | POA: Diagnosis not present

## 2018-04-18 DIAGNOSIS — H524 Presbyopia: Secondary | ICD-10-CM | POA: Diagnosis not present

## 2018-04-18 DIAGNOSIS — H25813 Combined forms of age-related cataract, bilateral: Secondary | ICD-10-CM | POA: Diagnosis not present

## 2018-04-18 DIAGNOSIS — H5203 Hypermetropia, bilateral: Secondary | ICD-10-CM | POA: Diagnosis not present

## 2018-06-04 ENCOUNTER — Encounter: Payer: Self-pay | Admitting: Nurse Practitioner

## 2018-06-04 ENCOUNTER — Ambulatory Visit (INDEPENDENT_AMBULATORY_CARE_PROVIDER_SITE_OTHER): Payer: Medicare Other

## 2018-06-04 ENCOUNTER — Ambulatory Visit (INDEPENDENT_AMBULATORY_CARE_PROVIDER_SITE_OTHER): Payer: Medicare Other | Admitting: Nurse Practitioner

## 2018-06-04 VITALS — BP 142/62 | HR 80 | Temp 98.3°F | Ht 65.0 in | Wt 164.0 lb

## 2018-06-04 DIAGNOSIS — J40 Bronchitis, not specified as acute or chronic: Secondary | ICD-10-CM | POA: Diagnosis not present

## 2018-06-04 DIAGNOSIS — J0100 Acute maxillary sinusitis, unspecified: Secondary | ICD-10-CM | POA: Diagnosis not present

## 2018-06-04 DIAGNOSIS — R05 Cough: Secondary | ICD-10-CM | POA: Diagnosis not present

## 2018-06-04 DIAGNOSIS — R059 Cough, unspecified: Secondary | ICD-10-CM

## 2018-06-04 MED ORDER — ALBUTEROL SULFATE HFA 108 (90 BASE) MCG/ACT IN AERS: 2.0000 | INHALATION_SPRAY | Freq: Four times a day (QID) | RESPIRATORY_TRACT | 0 refills | Status: DC | PRN

## 2018-06-04 MED ORDER — HYDROCODONE-HOMATROPINE 5-1.5 MG/5ML PO SYRP: 5.0000 mL | ORAL_SOLUTION | Freq: Four times a day (QID) | ORAL | 0 refills | Status: DC | PRN

## 2018-06-04 MED ORDER — AZITHROMYCIN 250 MG PO TABS: ORAL_TABLET | ORAL | 0 refills | Status: DC

## 2018-06-04 NOTE — Progress Notes (Signed)
Subjective:    Patient ID: Brianna [name], female    DOB: [birth year], 67 y.o.   MRN: [id number]   Chief Complaint: Sinus Problem; Cough; and Shortness of Breath   HPI Patient come sin today c/o of cough and congestion. Started with congestion over the past week and then turned into cough with some SOB. She has taken 2 doses of tylenol but that is all.    Review of Systems  Constitutional: Positive for appetite change (decreased) and fatigue. Negative for chills and fever.  HENT: Positive for congestion and ear pain. Negative for sore throat and trouble swallowing.   Respiratory: Positive for cough and shortness of breath.   Cardiovascular: Negative.   Gastrointestinal: Negative.   Genitourinary: Negative.   Neurological: Negative for headaches.  Psychiatric/Behavioral: Negative.   All other systems reviewed and are negative.      Objective:   Physical Exam  Constitutional: She is oriented to person, place, and time. She appears well-developed and well-nourished. She appears distressed.  HENT:  Head: Normocephalic.  Mouth/Throat: Oropharynx is clear and moist.  Eyes: Pupils are equal, round, and reactive to light. EOM are normal.  Neck: Normal range of motion. Neck supple.  Cardiovascular: Normal rate and normal heart sounds.  Pulmonary/Chest: No tachypnea. No respiratory distress. She has decreased breath sounds in the left upper field. She has wheezes in the right upper field, the right middle field, the right lower field, the left upper field, the left middle field and the left lower field. She has rales in the left upper field. She exhibits no tenderness.  Abdominal: Soft. Bowel sounds are normal.  Neurological: She is alert and oriented to person, place, and time.  Skin: Skin is warm and dry. Capillary refill takes 2 to 3 seconds.  Psychiatric: She has a normal mood and affect. Her behavior is normal.   BP (!) 142/62   Pulse 80   Temp 98.3 F (36.8 C) (Oral)   Ht 5\' 5"   (1.651 m)   Wt 164 lb (74.4 kg)   SpO2 94%   BMI 27.29 kg/m   Chest x ray- bronchitic changes- sent stat to radiology-Preliminary reading by Ronnald Collum, FNP  [facility]       Assessment & Plan:  Brianna [name] in today with chief complaint of Sinus Problem; Cough; and Shortness of Breath   1. Cough - DG Chest 2 View; Future  2. Acute maxillary sinusitis, recurrence not specified 1. Take meds as prescribed 2. Use a cool mist humidifier especially during the winter months and when heat has been humid. 3. Use saline nose sprays frequently 4. Saline irrigations of the nose can be very helpful if [name] frequently.  * 4X daily for 1 week*  * Use of a nettie pot can be helpful with this. Follow directions with this* 5. Drink plenty of fluids 6. Keep thermostat turn down low 7.For any cough or congestion  Use plain Mucinex- regular strength or max strength is fine   * Children- consult with Pharmacist for dosing 8. For fever or aces or pains- take tylenol or ibuprofen appropriate for age and weight.  * for fevers greater than 101 orally you may alternate ibuprofen and tylenol every  3 hours.    - azithromycin (ZITHROMAX Z-PAK) 250 MG tablet; As directed  Dispense: 6 tablet; Refill: 0  3. Bronchitis Really need to try to stop smoking - HYDROcodone-homatropine (HYCODAN) 5-1.5 MG/5ML syrup; Take 5 mLs by mouth every 6 (six) hours as needed for  cough.  Dispense: 120 mL; Refill: 0   Brianna Hassell Done, FNP

## 2018-06-04 NOTE — Addendum Note (Signed)
Addended by: Chevis Pretty on: 06/04/2018 10:49 AM   Modules accepted: Orders

## 2018-06-04 NOTE — Patient Instructions (Signed)

## 2018-09-10 ENCOUNTER — Ambulatory Visit: Payer: Medicare Other

## 2019-02-25 ENCOUNTER — Ambulatory Visit (INDEPENDENT_AMBULATORY_CARE_PROVIDER_SITE_OTHER): Payer: Medicare Other | Admitting: *Deleted

## 2019-02-25 ENCOUNTER — Other Ambulatory Visit: Payer: Self-pay | Admitting: *Deleted

## 2019-02-25 ENCOUNTER — Encounter: Payer: Self-pay | Admitting: *Deleted

## 2019-02-25 VITALS — BP 145/66 | HR 73 | Ht 65.0 in | Wt 165.0 lb

## 2019-02-25 DIAGNOSIS — Z0000 Encounter for general adult medical examination without abnormal findings: Secondary | ICD-10-CM | POA: Diagnosis not present

## 2019-02-25 DIAGNOSIS — Z1212 Encounter for screening for malignant neoplasm of rectum: Secondary | ICD-10-CM

## 2019-02-25 DIAGNOSIS — Z1211 Encounter for screening for malignant neoplasm of colon: Secondary | ICD-10-CM

## 2019-02-25 MED ORDER — ALBUTEROL SULFATE HFA 108 (90 BASE) MCG/ACT IN AERS: 2.0000 | INHALATION_SPRAY | Freq: Four times a day (QID) | RESPIRATORY_TRACT | 0 refills | Status: DC | PRN

## 2019-02-25 NOTE — Progress Notes (Addendum)
Subjective:   Brianna [name] is a 68 y.o. female who presents for a Initial Medicare Annual Wellness Visit.  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  She enjoys reading, and babysitting her granddaughter.  She lives at home with her husband and dog.  She has 2 children and 1 grandchild.    Patient Care Team: Sharion Balloon, FNP as PCP - General (Family Medicine)  Hospitalizations, surgeries, and ER visits in previous 12 months No hospitalizations, ER visits, or surgeries this past year.   Review of Systems    Patient reports that her overall health is worse compared to last year due to increased shortness of breath  Cardiac Risk Factors include: advanced age (>9men, >23 women);sedentary lifestyle;smoking/ tobacco exposure  Musculoskeletal- hand pain L>R  All other systems negative       Current Medications (verified) Outpatient Encounter Medications as of 02/25/2019  Medication Sig  . Calcium Carbonate-Vitamin D (CALCIUM 600+D) 600-400 MG-UNIT tablet Take 1 tablet by mouth daily.  . [DISCONTINUED] albuterol (PROVENTIL HFA;VENTOLIN HFA) 108 (90 Base) MCG/ACT inhaler Inhale 2 puffs into the lungs every 6 (six) hours as needed for wheezing or shortness of breath.  Marland Kitchen HYDROcodone-homatropine (HYCODAN) 5-1.5 MG/5ML syrup Take 5 mLs by mouth every 6 (six) hours as needed for cough. (Patient not taking: Reported on 02/25/2019)  . [DISCONTINUED] azithromycin (ZITHROMAX Z-PAK) 250 MG tablet As directed   No facility-administered encounter medications on file as of 02/25/2019.     Allergies (verified) Patient has no known allergies.   History: Past Medical History:  Diagnosis Date  . Cataract    Past Surgical History:  Procedure Laterality Date  . BREAST LUMPECTOMY Left 90's  . TUBAL LIGATION     Family History  Problem Relation Age of Onset  . Heart disease Mother   . Osteoporosis Mother   . Hypertension Mother   . Cancer Father        lung  . Diabetes  Father   . Cancer Sister        brain  . Diabetes Sister   . Cancer Brother        brain  . Osteoporosis Sister   . Cancer Sister        breast  . Diabetes Brother   . Diabetes Brother    Social History   Socioeconomic History  . Marital status: Married    Spouse name: Not on file  . Number of children: 2  . Years of education: Not on file  . Highest education level: Bachelor's degree (e.g., BA, AB, BS)  Occupational History  . Occupation: Retired    Fish farm manager: [employer]    Comment: [place]  Social Needs  . Financial resource strain: Not hard at all  . Food insecurity:    Worry: Never true    Inability: Never true  . Transportation needs:    Medical: No    Non-medical: No  Tobacco Use  . Smoking status: Heavy Tobacco Smoker    Packs/day: 1.50    Years: 46.00    Pack years: 69.00  . Smokeless tobacco: Never Used  Substance and Sexual Activity  . Alcohol use: Yes    Comment: occasional  . Drug use: No  . Sexual activity: Not Currently  Lifestyle  . Physical activity:    Days per week: 3 days    Minutes per session: 30 min  . Stress: Only a little  Relationships  . Social  connections:    Talks on phone: More than three times a week    Gets together: More than three times a week    Attends religious service: Never    Active member of club or organization: No    Attends meetings of clubs or organizations: Never    Relationship status: Married  Other Topics Concern  . Not on file  Social History Narrative  . Not on file     Clinical Intake:     Pain Score: 5                   Activities of Daily Living In your present state of health, do you have any difficulty performing the following activities: 02/25/2019  Hearing? N  Vision? N  Difficulty concentrating or making decisions? N  Walking or climbing stairs? Y  Comment Due to shortness of breath  Dressing or bathing? N  Doing errands, shopping? N  Preparing Food and eating ?  N  Using the Toilet? N  In the past six months, have you accidently leaked urine? Y  Comment With coughing or sneezing  Do you have problems with loss of bowel control? N  Managing your Medications? N  Managing your Finances? N  Housekeeping or managing your Housekeeping? N  Some recent data might be hidden     Exercise Current Exercise Habits: Walks with her dog 3-4 days per week for 30 minutes, Exercise limited by: respiratory conditions(s).  Diet Consumes 3 meals a day and 0 snacks a day.  The patient feels that she mostly follow a Regular diet.  Diet History  Patient states she has access to all the food she needs.  Recommended diet of mostly non-starchy vegetables, fruits, lean proteins, and whole grains.   Depression Screen PHQ 2/9 Scores 02/25/2019 06/04/2018 01/29/2018 10/02/2016 05/16/2016 12/03/2015 07/13/2015  PHQ - 2 Score 0 0 0 0 0 0 0     Fall Risk Fall Risk  02/25/2019 06/04/2018 01/29/2018 10/02/2016 05/16/2016  Falls in the past year? 0 No No No No     Objective:    Today's Vitals   02/25/19 1408 02/25/19 1451  BP: (!) 176/80 (!) 145/66  Pulse: 71 73  Weight: 165 lb (74.8 kg)   Height: 5\' 5"  (1.651 m)   PainSc: 5    PainLoc: Hand    Body mass index is 27.46 kg/m.  Advanced Directives 02/25/2019 05/16/2016  Does Patient Have a Medical Advance Directive? No No  Would patient like information on creating a medical advance directive? Yes (MAU/Ambulatory/Procedural Areas - Information given) Yes - Educational materials given    Hearing/Vision  No hearing or vision deficits noted during visit.  Cognitive Function: MMSE - Mini Mental State Exam 02/25/2019  Orientation to time 5  Orientation to Place 5  Registration 3  Attention/ Calculation 5  Recall 3  Language- name 2 objects 2  Language- repeat 1  Language- follow 3 step command 3  Language- read & follow direction 1  Write a sentence 1  Copy design 1  Total score 30          Immunizations and  Health Maintenance Immunization History  Administered Date(s) Administered  . Influenza Split 09/24/2013  . Influenza, High Dose Seasonal PF 09/23/2016, 02/20/2018  . Influenza,inj,Quad PF,6+ Mos 10/28/2014, 12/03/2015  . Influenza,inj,quad, With Preservative 01/06/2019  . Pneumococcal Conjugate-13 12/03/2015  . Pneumococcal Polysaccharide-23 02/20/2018  . Tdap 10/28/2014   Health Maintenance Due  Topic Date Due  .  COLONOSCOPY  12/25/2008  . MAMMOGRAM  06/07/2018   Health Maintenance  Topic Date Due  . COLONOSCOPY  12/25/2008  . MAMMOGRAM  06/07/2018  . TETANUS/TDAP  10/28/2024  . INFLUENZA VACCINE  Completed  . DEXA SCAN  Completed  . Hepatitis C Screening  Completed  . PNA vac Low Risk Adult  Completed  Patient agreeable to completing cologuard.  Test ordered. Scheduled for mammogram 03/04/2019.        Assessment:   This is a routine wellness examination for Brianna [name].    Plan:    Goals    . Weight (lb) < 155 lb (70.3 kg) (pt-stated)     Decrease portion sizes, and fried foods.         Health Maintenance & Additional Screening Recommendations  Smoking cessation counseling Advanced directives: has NO advanced directive  - add't info requested. Referral to SW: no  Lung: Low Dose CT Chest recommended if Age 47-80 years, 30 pack-year currently smoking OR have quit w/in 15years. Patient does qualify. Hepatitis C Screening recommended: completed 05/16/2016   Today's Orders Orders Placed This Encounter  Procedures  . Cologuard    Keep f/u with Sharion Balloon, FNP and any other specialty appointments you may have Continue current medications Move carefully to avoid falls.  Aim for at least 150 minutes of moderate activity a week.  Read or work on puzzles daily Stay connected with friends and family  I have personally reviewed and noted the following in the patient's chart:   . Medical and social history . Use of alcohol, tobacco or illicit drugs  . Current  medications and supplements . Functional ability and status . Nutritional status . Physical activity . Advanced directives . List of other physicians . Hospitalizations, surgeries, and ER visits in previous 12 months . Vitals . Screenings to include cognitive, depression, and falls . Referrals and appointments  In addition, I have reviewed and discussed with patient certain preventive protocols, quality metrics, and best practice recommendations. A written personalized care plan for preventive services as well as general preventive health recommendations were provided to patient.     Nolberto Hanlon, RN  02/25/2019    I have reviewed and agree with the above AWV documentation.   Evelina Dun, FNP

## 2019-02-25 NOTE — Patient Instructions (Addendum)
You set a goal of losing 10 lbs.  To work on your goal, you plan to reduce service sizes and avoid fried foods.  Please review the information given on Advance Directives.  If you complete the paperwork, please bring a copy to our office to be filed in your medical record.    Cologuard  Your provider has prescribed Cologuard, an easy-to-use, noninvasive test for colon cancer screening, based on the latest advances in stool DNA science.   Here's what will happen next:  1. You may receive a call or email from [employer] to confirm your mailing address and insurance information 2. Your kit will be shipped directly to you 3. You collect your stool sample in the privacy of your own home. Please follow the instructions that come with the kit 4. You return the kit via [facility] shipping or pick-up, in the same box it arrived in 5. You should receive a call with the results once they are available. If you do not receive a call, please contact our office at [phone]  Insurance Coverage  Cologuard is covered by Medicare and most major insurers Cologuard is covered by Medicare and Medicare Advantage with no co-pay or deductible for eligible patients ages 67-85. Nationwide, more than 94% of Cologuard patients have no out-of-pocket cost for screening. . Based on the [place] should be covered by most private insurers with no co-pay or deductible for eligible patients (ages 17-75; at average risk for colon cancer; without symptoms). Currently, ~74% of Cologuard patients 45-49 have had no out-of-pocket cost for screening.  . Many national and regional payers have begun paying for CRC screening at 74. [employer] continues to work with payers to expand coverage and access for patients ages 57-49.   Only your healthcare insurance provider can confirm how Cologuard will be covered for you. If you have questions about coverage, you can contact your insurance company  directly or ask the specialists at [employer] to do that for you. A Customer Support Specialist can be reached at [phone].    Patient Support Screening for colon cancer is very important to your good health, so if you have any questions at all, please call [facility] Customer Support Specialists at [phone]. They are available 24 hours a day, 6 days a week. An instructional video is available to view online at [email or web address]   *Information and graphics obtained from [email or web address]  You have been scheduled for your mammogram on 03/04/2019 at 2:30 pm on the mobile unit here at [facility].    You have been scheduled for an appointment with Evelina Dun, FNP on 03/10/2019 at 8:40 am.   Thank you for coming in for you Annual Wellness Visit today!  Preventive Care 20 Years and Older, Female Preventive care refers to lifestyle choices and visits with your health care provider that can promote health and wellness. What does preventive care include?  A yearly physical exam. This is also called an annual well check.  Dental exams once or twice a year.  Routine eye exams. Ask your health care provider how often you should have your eyes checked.  Personal lifestyle choices, including: ? Daily care of your teeth and gums. ? Regular physical activity. ? Eating a healthy diet. ? Avoiding tobacco and drug use. ? Limiting alcohol use. ? Practicing safe sex. ? Taking low-dose aspirin every day. ? Taking vitamin and mineral supplements as recommended by your health care provider. What happens during  an annual well check? The services and screenings done by your health care provider during your annual well check will depend on your age, overall health, lifestyle risk factors, and family history of disease. Counseling Your health care provider may ask you questions about your:  Alcohol use.  Tobacco use.  Drug use.  Emotional  well-being.  Home and relationship well-being.  Sexual activity.  Eating habits.  History of falls.  Memory and ability to understand (cognition).  Work and work Statistician.  Reproductive health.  Screening You may have the following tests or measurements:  Height, weight, and BMI.  Blood pressure.  Lipid and cholesterol levels. These may be checked every 5 years, or more frequently if you are over 24 years old.  Skin check.  Lung cancer screening. You may have this screening every year starting at age 70 if you have a 30-pack-year history of smoking and currently smoke or have quit within the past 15 years.  Colorectal cancer screening. All adults should have this screening starting at age 40 and continuing until age 32. You will have tests every 1-10 years, depending on your results and the type of screening test. People at increased risk should start screening at an earlier age. Screening tests may include: ? Guaiac-based fecal occult blood testing. ? Fecal immunochemical test (FIT). ? Stool DNA test. ? Virtual colonoscopy. ? Sigmoidoscopy. During this test, a flexible tube with a tiny camera (sigmoidoscope) is used to examine your rectum and lower colon. The sigmoidoscope is inserted through your anus into your rectum and lower colon. ? Colonoscopy. During this test, a long, thin, flexible tube with a tiny camera (colonoscope) is used to examine your entire colon and rectum.  Hepatitis C blood test.  Hepatitis B blood test.  Sexually transmitted disease (STD) testing.  Diabetes screening. This is done by checking your blood sugar (glucose) after you have not eaten for a while (fasting). You may have this done every 1-3 years.  Bone density scan. This is done to screen for osteoporosis. You may have this done starting at age 45.  Mammogram. This may be done every 1-2 years. Talk to your health care provider about how often you should have regular mammograms. Talk  with your health care provider about your test results, treatment options, and if necessary, the need for more tests. Vaccines Your health care provider may recommend certain vaccines, such as:  Influenza vaccine. This is recommended every year.  Tetanus, diphtheria, and acellular pertussis (Tdap, Td) vaccine. You may need a Td booster every 10 years.  Varicella vaccine. You may need this if you have not been vaccinated.  Zoster vaccine. You may need this after age 67.  Measles, mumps, and rubella (MMR) vaccine. You may need at least one dose of MMR if you were born in 1957 or later. You may also need a second dose.  Pneumococcal 13-valent conjugate (PCV13) vaccine. One dose is recommended after age 76.  Pneumococcal polysaccharide (PPSV23) vaccine. One dose is recommended after age 76.  Meningococcal vaccine. You may need this if you have certain conditions.  Hepatitis A vaccine. You may need this if you have certain conditions or if you travel or work in places where you may be exposed to hepatitis A.  Hepatitis B vaccine. You may need this if you have certain conditions or if you travel or work in places where you may be exposed to hepatitis B.  Haemophilus influenzae type b (Hib) vaccine. You may need this if  you have certain conditions. Talk to your health care provider about which screenings and vaccines you need and how often you need them. This information is not intended to replace advice given to you by your health care provider. Make sure you discuss any questions you have with your health care provider. Document Released: 01/07/2016 Document Revised: 01/31/2018 Document Reviewed: 10/12/2015 Elsevier Interactive Patient Education  2019 [employer] with Quitting Smoking  Quitting smoking is a physical and mental challenge. You will face cravings, withdrawal symptoms, and temptation. Before quitting, work with your health care provider to make a plan that can  help you cope. Preparation can help you quit and keep you from giving in. How can I cope with cravings? Cravings usually last for 5-10 minutes. If you get through it, the craving will pass. Consider taking the following actions to help you cope with cravings:  Keep your mouth busy: ? Chew sugar-free gum. ? Suck on hard candies or a straw. ? Brush your teeth.  Keep your hands and body busy: ? Immediately change to a different activity when you feel a craving. ? Squeeze or play with a ball. ? Do an activity or a hobby, like making bead jewelry, practicing needlepoint, or working with wood. ? Mix up your normal routine. ? Take a short exercise break. Go for a quick walk or run up and down stairs. ? Spend time in public places where smoking is not allowed.  Focus on doing something kind or helpful for someone else.  Call a friend or family member to talk during a craving.  Join a support group.  Call a quit line, such as 1-800-QUIT-NOW.  Talk with your health care provider about medicines that might help you cope with cravings and make quitting easier for you. How can I deal with withdrawal symptoms? Your body may experience negative effects as it tries to get used to not having nicotine in the system. These effects are called withdrawal symptoms. They may include:  Feeling hungrier than normal.  Trouble concentrating.  Irritability.  Trouble sleeping.  Feeling depressed.  Restlessness and agitation.  Craving a cigarette. To manage withdrawal symptoms:  Avoid places, people, and activities that trigger your cravings.  Remember why you want to quit.  Get plenty of sleep.  Avoid coffee and other caffeinated drinks. These may worsen some of your symptoms. How can I handle social situations? Social situations can be difficult when you are quitting smoking, especially in the first few weeks. To manage this, you can:  Avoid parties, bars, and other social situations where  people might be smoking.  Avoid alcohol.  Leave right away if you have the urge to smoke.  Explain to your family and friends that you are quitting smoking. Ask for understanding and support.  Plan activities with friends or family where smoking is not an option. What are some ways I can cope with stress? Wanting to smoke may cause stress, and stress can make you want to smoke. Find ways to manage your stress. Relaxation techniques can help. For example:  Breathe slowly and deeply, in through your nose and out through your mouth.  Listen to soothing, relaxing music.  Talk with a family member or friend about your stress.  Light a candle.  Soak in a bath or take a shower.  Think about a peaceful place. What are some ways I can prevent weight gain? Be aware that many people gain weight after they quit smoking. However, not  everyone does. To keep from gaining weight, have a plan in place before you quit and stick to the plan after you quit. Your plan should include:  Having healthy snacks. When you have a craving, it may help to: ? Eat plain popcorn, crunchy carrots, celery, or other cut vegetables. ? Chew sugar-free gum.  Changing how you eat: ? Eat small portion sizes at meals. ? Eat 4-6 small meals throughout the day instead of 1-2 large meals a day. ? Be mindful when you eat. Do not watch television or do other things that might distract you as you eat.  Exercising regularly: ? Make time to exercise each day. If you do not have time for a long workout, do short bouts of exercise for 5-10 minutes several times a day. ? Do some form of strengthening exercise, like weight lifting, and some form of aerobic exercise, like running or swimming.  Drinking plenty of water or other low-calorie or no-calorie drinks. Drink 6-8 glasses of water daily, or as much as instructed by your health care provider. Summary  Quitting smoking is a physical and mental challenge. You will face  cravings, withdrawal symptoms, and temptation to smoke again. Preparation can help you as you go through these challenges.  You can cope with cravings by keeping your mouth busy (such as by chewing gum), keeping your body and hands busy, and making calls to family, friends, or a helpline for people who want to quit smoking.  You can cope with withdrawal symptoms by avoiding places where people smoke, avoiding drinks with caffeine, and getting plenty of rest.  Ask your health care provider about the different ways to prevent weight gain, avoid stress, and handle social situations. This information is not intended to replace advice given to you by your health care provider. Make sure you discuss any questions you have with your health care provider. Document Released: 12/08/2016 Document Revised: 12/08/2016 Document Reviewed: 12/08/2016 Elsevier Interactive Patient Education  2019 [employer].

## 2019-03-04 DIAGNOSIS — Z1231 Encounter for screening mammogram for malignant neoplasm of breast: Secondary | ICD-10-CM | POA: Diagnosis not present

## 2019-03-04 LAB — HM MAMMOGRAPHY

## 2019-03-10 ENCOUNTER — Ambulatory Visit (INDEPENDENT_AMBULATORY_CARE_PROVIDER_SITE_OTHER): Payer: Medicare Other | Admitting: Family

## 2019-03-10 ENCOUNTER — Encounter: Payer: Self-pay | Admitting: Family

## 2019-03-10 ENCOUNTER — Other Ambulatory Visit: Payer: Self-pay

## 2019-03-10 VITALS — BP 139/67 | HR 65 | Temp 96.7°F | Ht 65.0 in | Wt 167.4 lb

## 2019-03-10 DIAGNOSIS — F17200 Nicotine dependence, unspecified, uncomplicated: Secondary | ICD-10-CM

## 2019-03-10 DIAGNOSIS — Z0001 Encounter for general adult medical examination with abnormal findings: Secondary | ICD-10-CM

## 2019-03-10 DIAGNOSIS — Z6829 Body mass index (BMI) 29.0-29.9, adult: Secondary | ICD-10-CM

## 2019-03-10 DIAGNOSIS — R030 Elevated blood-pressure reading, without diagnosis of hypertension: Secondary | ICD-10-CM

## 2019-03-10 DIAGNOSIS — Z0000 Encounter for general adult medical examination without abnormal findings: Secondary | ICD-10-CM | POA: Diagnosis not present

## 2019-03-10 DIAGNOSIS — J449 Chronic obstructive pulmonary disease, unspecified: Secondary | ICD-10-CM | POA: Diagnosis not present

## 2019-03-10 MED ORDER — TIOTROPIUM BROMIDE MONOHYDRATE 1.25 MCG/ACT IN AERS: 2.0000 | INHALATION_SPRAY | Freq: Every day | RESPIRATORY_TRACT | 6 refills | Status: DC

## 2019-03-10 NOTE — Progress Notes (Signed)
Subjective:    Patient ID: Brianna [name], female    DOB: [birth year], 68 y.o.   MRN: [id number]  Chief Complaint  Patient presents with  . Annual Exam    due lab work    HPI PT presents to the office today for CPE. PT has COPD and continues to smoke a 1 1/2 pack a day. She only has an albuterol inhaler she uses as needed. She has noticed increased SOB with walking.   PT's BP is elevated today.   PT has osteopenia and last Dexa Scan was 05/16/16. She does not wish to get this done today.    Review of Systems  All other systems reviewed and are negative.      Objective:   Physical Exam Vitals signs reviewed.  Constitutional:      General: She is not in acute distress.    Appearance: She is well-developed.  HENT:     Head: Normocephalic and atraumatic.     Right Ear: Tympanic membrane normal.     Left Ear: Tympanic membrane normal.  Eyes:     Pupils: Pupils are equal, round, and reactive to light.  Neck:     Musculoskeletal: Normal range of motion and neck supple.     Thyroid: No thyromegaly.  Cardiovascular:     Rate and Rhythm: Normal rate and regular rhythm.     Heart sounds: Normal heart sounds. No murmur.  Pulmonary:     Effort: Pulmonary effort is normal. No respiratory distress.     Breath sounds: Decreased breath sounds present. No wheezing.     Comments: Intermittent coarse nonproductive cough Abdominal:     General: Bowel sounds are normal. There is no distension.     Palpations: Abdomen is soft.     Tenderness: There is no abdominal tenderness.  Musculoskeletal: Normal range of motion.        General: No tenderness.  Skin:    General: Skin is warm and dry.  Neurological:     Mental Status: She is alert and oriented to person, place, and time.     Cranial Nerves: No cranial nerve deficit.     Deep Tendon Reflexes: Reflexes are normal and symmetric.  Psychiatric:        Behavior: Behavior normal.        Thought Content: Thought content normal.      Judgment: Judgment normal.       BP 139/67   Pulse 65   Temp (!) 96.7 F (35.9 C) (Oral)   Ht '5\' 5"'$  (1.651 m)   Wt 167 lb 6.4 oz (75.9 kg)   BMI 27.86 kg/m      Assessment & Plan:  Brianna [name] comes in today with chief complaint of Annual Exam (due lab work)   Diagnosis and orders addressed:  1. Annual physical exam - CMP14+EGFR - CBC with Differential/Platelet - Lipid panel - TSH  2. BMI 29.0-29.9,adult - CMP14+EGFR - CBC with Differential/Platelet  3. Tobacco use disorder - CMP14+EGFR - CBC with Differential/Platelet  4. Chronic obstructive pulmonary disease, unspecified COPD type ([place]) Will start Spiriva today Smoking cessation discussed - CMP14+EGFR - CBC with Differential/Platelet - Tiotropium Bromide Monohydrate (SPIRIVA RESPIMAT) 1.25 MCG/ACT AERS; Inhale 2 puffs into the lungs daily.  Dispense: 1 Inhaler; Refill: 6  5. Elevated blood pressure reading Improved at the end of our visit Low salt diet   Labs pending Health Maintenance reviewed Diet and exercise encouraged  Follow up plan: 6 months    [facility]  Lenna Gilford, [place]

## 2019-03-10 NOTE — Patient Instructions (Addendum)
Chronic Obstructive Pulmonary Disease Chronic obstructive pulmonary disease (COPD) is a long-term (chronic) lung problem. When you have COPD, it is hard for air to get in and out of your lungs. Usually the condition gets worse over time, and your lungs will never return to normal. There are things you can do to keep yourself as healthy as possible.  Your doctor may treat your condition with: ? Medicines. ? Oxygen. ? Lung surgery.  Your doctor may also recommend: ? Rehabilitation. This includes steps to make your body work better. It may involve a team of specialists. ? Quitting smoking, if you smoke. ? Exercise and changes to your diet. ? Comfort measures (palliative care). Follow these instructions at home: Medicines  Take over-the-counter and prescription medicines only as told by your doctor.  Talk to your doctor before taking any cough or allergy medicines. You may need to avoid medicines that cause your lungs to be dry. Lifestyle  If you smoke, stop. Smoking makes the problem worse. If you need help quitting, ask your doctor.  Avoid being around things that make your breathing worse. This may include smoke, chemicals, and fumes.  Stay active, but remember to rest as well.  Learn and use tips on how to relax.  Make sure you get enough sleep. Most adults need at least 7 hours of sleep every night.  Eat healthy foods. Eat smaller meals more often. Rest before meals. Controlled breathing Learn and use tips on how to control your breathing as told by your doctor. Try:  Breathing in (inhaling) through your nose for 1 second. Then, pucker your lips and breath out (exhale) through your lips for 2 seconds.  Putting one hand on your belly (abdomen). Breathe in slowly through your nose for 1 second. Your hand on your belly should move out. Pucker your lips and breathe out slowly through your lips. Your hand on your belly should move in as you breathe out.  Controlled coughing Learn  and use controlled coughing to clear mucus from your lungs. Follow these steps: 1. Lean your head a little forward. 2. Breathe in deeply. 3. Try to hold your breath for 3 seconds. 4. Keep your mouth slightly open while coughing 2 times. 5. Spit any mucus out into a tissue. 6. Rest and do the steps again 1 or 2 times as needed. General instructions  Make sure you get all the shots (vaccines) that your doctor recommends. Ask your doctor about a flu shot and a pneumonia shot.  Use oxygen therapy and pulmonary rehabilitation if told by your doctor. If you need home oxygen therapy, ask your doctor if you should buy a tool to measure your oxygen level (oximeter).  Make a COPD action plan with your doctor. This helps you to know what to do if you feel worse than usual.  Manage any other conditions you have as told by your doctor.  Avoid going outside when it is very hot, cold, or humid.  Avoid people who have a sickness you can catch (contagious).  Keep all follow-up visits as told by your doctor. This is important. Contact a doctor if:  You cough up more mucus than usual.  There is a change in the color or thickness of the mucus.  It is harder to breathe than usual.  Your breathing is faster than usual.  You have trouble sleeping.  You need to use your medicines more often than usual.  You have trouble doing your normal activities such as getting dressed   or walking around the house. Get help right away if:  You have shortness of breath while resting.  You have shortness of breath that stops you from: ? Being able to talk. ? Doing normal activities.  Your chest hurts for longer than 5 minutes.  Your skin color is more blue than usual.  Your pulse oximeter shows that you have low oxygen for longer than 5 minutes.  You have a fever.  You feel too tired to breathe normally. Summary  Chronic obstructive pulmonary disease (COPD) is a long-term lung problem.  The way your  lungs work will never return to normal. Usually the condition gets worse over time. There are things you can do to keep yourself as healthy as possible.  Take over-the-counter and prescription medicines only as told by your doctor.  If you smoke, stop. Smoking makes the problem worse. This information is not intended to replace advice given to you by your health care provider. Make sure you discuss any questions you have with your health care provider. Document Released: 05/29/2008 Document Revised: 01/15/2017 Document Reviewed: 01/15/2017 Elsevier Interactive Patient Education  2019 Elsevier Inc.  

## 2019-03-11 ENCOUNTER — Other Ambulatory Visit: Payer: Self-pay | Admitting: Family

## 2019-03-11 LAB — LIPID PANEL
CHOLESTEROL TOTAL: 190 mg/dL (ref 100–199)
Chol/HDL Ratio: 2.8 ratio (ref 0.0–4.4)
HDL: 69 mg/dL (ref >39)
LDL CALC: 95 mg/dL (ref 0–99)
TRIGLYCERIDES: 128 mg/dL (ref 0–149)
VLDL CHOLESTEROL CAL: 26 mg/dL (ref 5–40)

## 2019-03-11 LAB — CBC WITH DIFFERENTIAL/PLATELET
BASOS ABS: 0.1 10*3/uL (ref 0.0–0.2)
Basos: 1 %
EOS (ABSOLUTE): 0.3 10*3/uL (ref 0.0–0.4)
EOS: 3 %
HEMOGLOBIN: 14.8 g/dL (ref 11.1–15.9)
Hematocrit: 42.8 % (ref 34.0–46.6)
IMMATURE GRANS (ABS): 0 10*3/uL (ref 0.0–0.1)
Immature Granulocytes: 0 %
Lymphocytes Absolute: 3.2 10*3/uL — ABNORMAL HIGH (ref 0.7–3.1)
Lymphs: 37 %
MCH: 31.1 pg (ref 26.6–33.0)
MCHC: 34.6 g/dL (ref 31.5–35.7)
MCV: 90 fL (ref 79–97)
MONOCYTES: 9 %
Monocytes Absolute: 0.8 10*3/uL (ref 0.1–0.9)
NEUTROS ABS: 4.4 10*3/uL (ref 1.4–7.0)
Neutrophils: 50 %
Platelets: 303 10*3/uL (ref 150–450)
RBC: 4.76 x10E6/uL (ref 3.77–5.28)
RDW: 12.2 % (ref 11.7–15.4)
WBC: 8.8 10*3/uL (ref 3.4–10.8)

## 2019-03-11 LAB — CMP14+EGFR
ALBUMIN: 4.1 g/dL (ref 3.8–4.8)
ALT: 14 IU/L (ref 0–32)
AST: 16 IU/L (ref 0–40)
Albumin/Globulin Ratio: 1.9 (ref 1.2–2.2)
Alkaline Phosphatase: 72 IU/L (ref 39–117)
BILIRUBIN TOTAL: 0.4 mg/dL (ref 0.0–1.2)
BUN/Creatinine Ratio: 16 (ref 12–28)
BUN: 11 mg/dL (ref 8–27)
CALCIUM: 9.5 mg/dL (ref 8.7–10.3)
CHLORIDE: 102 mmol/L (ref 96–106)
CO2: 24 mmol/L (ref 20–29)
CREATININE: 0.69 mg/dL (ref 0.57–1.00)
GFR calc non Af Amer: 89 mL/min/{1.73_m2} (ref >59)
GFR, EST AFRICAN AMERICAN: 103 mL/min/{1.73_m2} (ref >59)
GLUCOSE: 92 mg/dL (ref 65–99)
Globulin, Total: 2.2 g/dL (ref 1.5–4.5)
Potassium: 4.7 mmol/L (ref 3.5–5.2)
Sodium: 140 mmol/L (ref 134–144)
TOTAL PROTEIN: 6.3 g/dL (ref 6.0–8.5)

## 2019-03-11 LAB — TSH: TSH: 1.47 u[IU]/mL (ref 0.450–4.500)

## 2019-03-11 MED ORDER — ATORVASTATIN CALCIUM 20 MG PO TABS: 20.0000 mg | ORAL_TABLET | Freq: Every day | ORAL | 11 refills | Status: DC

## 2019-03-12 ENCOUNTER — Telehealth: Payer: Self-pay | Admitting: Family

## 2019-03-12 NOTE — Telephone Encounter (Signed)
Cholesterol numbers are in range so patient wants to know if she really needs lipitor?

## 2019-03-12 NOTE — Telephone Encounter (Signed)
Aware. 

## 2019-03-13 ENCOUNTER — Telehealth: Payer: Self-pay | Admitting: Family

## 2019-03-13 NOTE — Telephone Encounter (Signed)
As stated in the lab note, her cardiac risk is very high. Lipitor will decrease this risk, however, if she does not want to take this medication this is fine.

## 2019-03-13 NOTE — Telephone Encounter (Signed)
Patient notified or increased cardiac risk factors.  She is agreeable to taking Lipitor. States she will pick up and start medication.

## 2019-03-19 DIAGNOSIS — Z1212 Encounter for screening for malignant neoplasm of rectum: Secondary | ICD-10-CM | POA: Diagnosis not present

## 2019-03-19 DIAGNOSIS — Z1211 Encounter for screening for malignant neoplasm of colon: Secondary | ICD-10-CM | POA: Diagnosis not present

## 2019-03-20 ENCOUNTER — Other Ambulatory Visit: Payer: Self-pay | Admitting: *Deleted

## 2019-03-20 MED ORDER — ATORVASTATIN CALCIUM 20 MG PO TABS: 20.0000 mg | ORAL_TABLET | Freq: Every day | ORAL | 3 refills | Status: DC

## 2019-03-25 LAB — COLOGUARD: Cologuard: NEGATIVE

## 2019-09-19 ENCOUNTER — Other Ambulatory Visit: Payer: Self-pay | Admitting: Family

## 2019-11-01 ENCOUNTER — Other Ambulatory Visit: Payer: Self-pay | Admitting: Family

## 2019-11-01 DIAGNOSIS — J449 Chronic obstructive pulmonary disease, unspecified: Secondary | ICD-10-CM

## 2019-12-13 ENCOUNTER — Other Ambulatory Visit: Payer: Self-pay | Admitting: Family

## 2019-12-13 DIAGNOSIS — J449 Chronic obstructive pulmonary disease, unspecified: Secondary | ICD-10-CM

## 2019-12-15 ENCOUNTER — Ambulatory Visit (INDEPENDENT_AMBULATORY_CARE_PROVIDER_SITE_OTHER): Payer: Medicare Other | Admitting: Family

## 2019-12-15 ENCOUNTER — Encounter: Payer: Self-pay | Admitting: Family

## 2019-12-15 DIAGNOSIS — U071 COVID-19: Secondary | ICD-10-CM | POA: Diagnosis not present

## 2019-12-15 MED ORDER — DEXAMETHASONE 6 MG PO TABS: 6.0000 mg | ORAL_TABLET | Freq: Every day | ORAL | 0 refills | Status: DC

## 2019-12-15 MED ORDER — BENZONATATE 200 MG PO CAPS: 200.0000 mg | ORAL_CAPSULE | Freq: Three times a day (TID) | ORAL | 1 refills | Status: DC | PRN

## 2019-12-15 NOTE — Progress Notes (Signed)
   Virtual Visit via telephone Note Due to COVID-19 pandemic this visit was conducted virtually. This visit type was conducted due to national recommendations for restrictions regarding the COVID-19 Pandemic (e.g. social distancing, sheltering in place) in an effort to limit this patient's exposure and mitigate transmission in our community. All issues noted in this document were discussed and addressed.  A physical exam was not performed with this format.  I connected with Brianna [name] on 12/15/19 at 1:20 pm  by telephone and verified that I am speaking with the correct person using two identifiers. Brianna [name] is currently located at home and husband is currently with her during visit. The provider, Evelina Dun, FNP is located in their office at time of visit.  I discussed the limitations, risks, security and privacy concerns of performing an evaluation and management service by telephone and the availability of in person appointments. I also discussed with the patient that there may be a patient responsible charge related to this service. The patient expressed understanding and agreed to proceed.   History and Present Illness:  Pt calls the office today with congestion, cough, and SOB. She was diagnosed with COVID 12/12/19. She does have COPD and currently smoking a pack a day. She is currently taking spiriva daily and using albuterol as needed.  Cough This is a recurrent problem. The current episode started in the past 7 days. The problem has been unchanged. The problem occurs every few minutes. The cough is non-productive. Associated symptoms include ear congestion, headaches, postnasal drip, shortness of breath and wheezing. Pertinent negatives include no chills, ear pain, fever, myalgias or nasal congestion. The symptoms are aggravated by lying down.      Review of Systems  Constitutional: Negative for chills and fever.  HENT: Positive for postnasal drip. Negative for ear pain.     Respiratory: Positive for cough, shortness of breath and wheezing.   Musculoskeletal: Negative for myalgias.  Neurological: Positive for headaches.  All other systems reviewed and are negative.    Observations/Objective: No SOB or distress noted  Assessment and Plan: 1. COVID-19 virus detected Rest Force fluids Continue to self isolate Call if symptoms worsen or do not improve - dexamethasone (DECADRON) 6 MG tablet; Take 1 tablet (6 mg total) by mouth daily.  Dispense: 5 tablet; Refill: 0 - benzonatate (TESSALON) 200 MG capsule; Take 1 capsule (200 mg total) by mouth 3 (three) times daily as needed.  Dispense: 30 capsule; Refill: 1     I discussed the assessment and treatment plan with the patient. The patient was provided an opportunity to ask questions and all were answered. The patient agreed with the plan and demonstrated an understanding of the instructions.   The patient was advised to call back or seek an in-person evaluation if the symptoms worsen or if the condition fails to improve as anticipated.  The above assessment and management plan was discussed with the patient. The patient verbalized understanding of and has agreed to the management plan. Patient is aware to call the clinic if symptoms persist or worsen. Patient is aware when to return to the clinic for a follow-up visit. Patient educated on when it is appropriate to go to the emergency department.   Time call ended:  1:35 pm   I provided 15 minutes of non-face-to-face time during this encounter.    Evelina Dun, FNP

## 2020-01-09 ENCOUNTER — Other Ambulatory Visit: Payer: Self-pay | Admitting: Family

## 2020-01-19 ENCOUNTER — Other Ambulatory Visit: Payer: Self-pay | Admitting: Family

## 2020-01-19 DIAGNOSIS — J449 Chronic obstructive pulmonary disease, unspecified: Secondary | ICD-10-CM

## 2020-01-23 DIAGNOSIS — H43813 Vitreous degeneration, bilateral: Secondary | ICD-10-CM | POA: Diagnosis not present

## 2020-01-23 DIAGNOSIS — H524 Presbyopia: Secondary | ICD-10-CM | POA: Diagnosis not present

## 2020-02-02 DIAGNOSIS — H2513 Age-related nuclear cataract, bilateral: Secondary | ICD-10-CM | POA: Diagnosis not present

## 2020-02-02 DIAGNOSIS — H2013 Chronic iridocyclitis, bilateral: Secondary | ICD-10-CM | POA: Diagnosis not present

## 2020-02-02 DIAGNOSIS — H25013 Cortical age-related cataract, bilateral: Secondary | ICD-10-CM | POA: Diagnosis not present

## 2020-02-18 DIAGNOSIS — H2013 Chronic iridocyclitis, bilateral: Secondary | ICD-10-CM | POA: Diagnosis not present

## 2020-02-26 ENCOUNTER — Ambulatory Visit (INDEPENDENT_AMBULATORY_CARE_PROVIDER_SITE_OTHER): Payer: Medicare Other | Admitting: *Deleted

## 2020-02-26 DIAGNOSIS — Z0000 Encounter for general adult medical examination without abnormal findings: Secondary | ICD-10-CM

## 2020-02-26 NOTE — Progress Notes (Addendum)
MEDICARE ANNUAL WELLNESS VISIT  02/26/2020  Telephone Visit Disclaimer This Medicare AWV was conducted by telephone due to national recommendations for restrictions regarding the COVID-19 Pandemic (e.g. social distancing).  I verified, using two identifiers, that I am speaking with Adriana Reams or their authorized healthcare agent. I discussed the limitations, risks, security, and privacy concerns of performing an evaluation and management service by telephone and the potential availability of an in-person appointment in the future. The patient expressed understanding and agreed to proceed.   Subjective:  Tongela Oum is a 69 y.o. female patient of [facility], Theador Hawthorne, FNP who had a Medicare Annual Wellness Visit today via telephone. Timitra is retired and lives with her husband [name]. She has 2 grown children. She reports that she is socially active and does interact with friends/family regularly. She is minimally physically active and enjoys reading and spending time with her grandchildren. She also has a dog that she walks several times a day.   Patient Care Team: Sharion Balloon, FNP as PCP - General (Family Medicine)  Advanced Directives 02/26/2020 02/25/2019 05/16/2016  Does Patient Have a Medical Advance Directive? No No No  Would patient like information on creating a medical advance directive? No - Patient declined Yes (MAU/Ambulatory/Procedural Areas - Information given) Yes - Educational materials given    Hospital Utilization Over the Past 12 Months: # of hospitalizations or ER visits: 0 # of surgeries: 0  Review of Systems    Patient reports that her overall health is unchanged compared to last year.  History obtained from the patient.  Patient Reported Readings (BP, Pulse, CBG, Weight, etc) none  Pain Assessment Pain : No/denies pain     Current Medications & Allergies (verified) Allergies as of 02/26/2020   No Known Allergies      Medication List        Accurate as  of February 26, 2020  4:05 PM. If you have any questions, ask your nurse or doctor.          STOP taking these medications    benzonatate 200 MG capsule Commonly known as: TESSALON   dexamethasone 6 MG tablet Commonly known as: DECADRON       TAKE these medications    atorvastatin 20 MG tablet Commonly known as: Lipitor Take 1 tablet (20 mg total) by mouth daily.   Calcium Carbonate-Vitamin D 600-400 MG-UNIT tablet Commonly known as: Calcium 600+D Take 1 tablet by mouth daily.   prednisoLONE acetate 1 % ophthalmic suspension Commonly known as: PRED FORTE 1 drop 3 (three) times daily.   ProAir HFA 108 (90 Base) MCG/ACT inhaler Generic drug: albuterol 2 PUFFS EVERY 6 HOURS AS NEEDED FOR WHEEZING OR SHORTNESS OF BREATH   Prolensa 0.07 % Soln Generic drug: Bromfenac Sodium Apply 1 drop to eye 3 (three) times daily.   Spiriva Respimat 2.5 MCG/ACT Aers Generic drug: Tiotropium Bromide Monohydrate Inhale 2 puffs into the lungs daily. (Needs to be seen before next refill)        History (reviewed): Past Medical History:  Diagnosis Date   Cataract    Past Surgical History:  Procedure Laterality Date   BREAST LUMPECTOMY Left 33's   TUBAL LIGATION     Family History  Problem Relation Age of Onset   Heart disease Mother    Osteoporosis Mother    Hypertension Mother    Cancer Father        lung   Diabetes Father    Cancer Sister  brain   Diabetes Sister    Cancer Brother        brain   Osteoporosis Sister    Cancer Sister        breast   Diabetes Brother    Diabetes Brother    Social History   Socioeconomic History   Marital status: Married    Spouse name: [name]   Number of children: 2   Years of education: Not on file   Highest education level: Bachelor's degree (e.g., BA, AB, BS)  Occupational History   Occupation: Retired    Fish farm manager: [employer]    Comment: [employer]  Tobacco Use   Smoking status: Heavy Tobacco Smoker     Packs/day: 1.50    Years: 46.00    Pack years: 69.00   Smokeless tobacco: Never Used  Substance and Sexual Activity   Alcohol use: Yes    Comment: occasional   Drug use: No   Sexual activity: Not Currently  Other Topics Concern   Not on file  Social History Narrative   Cassadee is retired and lives with husband [name]. They have 2 children that they see regularly as well as grandchildren. She likes to read and spend time with the grandchildren. They have a dog that she walks multiple times per day for exercise.    Social Determinants of Health   Financial Resource Strain:    Difficulty of Paying Living Expenses: Not on file  Food Insecurity:    Worried About Charity fundraiser in the Last Year: Not on file   [employer] of Food in the Last Year: Not on file  Transportation Needs:    Lack of Transportation (Medical): Not on file   Lack of Transportation (Non-Medical): Not on file  Physical Activity:    Days of Exercise per Week: Not on file   Minutes of Exercise per Session: Not on file  Stress:    Feeling of Stress : Not on file  Social Connections:    Frequency of Communication with Friends and Family: Not on file   Frequency of Social Gatherings with Friends and Family: Not on file   Attends Religious Services: Not on file   Active Member of Clubs or Organizations: Not on file   Attends Archivist Meetings: Not on file   Marital Status: Not on file    Activities of Daily Living In your present state of health, do you have any difficulty performing the following activities: 02/26/2020  Hearing? Y  Comment not as good as it used to be  Vision? N  Difficulty concentrating or making decisions? N  Walking or climbing stairs? Y  Comment copd causes sob easily  Dressing or bathing? N  Doing errands, shopping? N  Preparing Food and eating ? N  Using the Toilet? N  In the past six months, have you accidently leaked urine? Y  Comment stress incontinence  Do you have  problems with loss of bowel control? N  Managing your Medications? N  Managing your Finances? N  Housekeeping or managing your Housekeeping? N  Some recent data might be hidden    Patient Education/ Literacy How often do you need to have someone help you when you read instructions, pamphlets, or other written materials from your doctor or pharmacy?: 1 - Never What is the last grade level you completed in school?: four years of college  Exercise Current Exercise Habits: Home exercise routine, Type of exercise: walking, Time (Minutes): 60, Frequency (Times/Week):  7, Weekly Exercise (Minutes/Week): 420, Intensity: Mild, Exercise limited by: respiratory conditions(s)  Diet Patient reports consuming 3 meals a day and 2 snack(s) a day Patient reports that her primary diet is: Regular Patient reports that she does have regular access to food.   Depression Screen PHQ 2/9 Scores 02/26/2020 03/10/2019 02/25/2019 06/04/2018 01/29/2018 10/02/2016 05/16/2016  PHQ - 2 Score 0 0 0 0 0 0 0     Fall Risk Fall Risk  02/26/2020 03/10/2019 02/25/2019 06/04/2018 01/29/2018  Falls in the past year? 0 0 0 No No     Objective:  Nashly Legendre seemed alert and oriented and she participated appropriately during our telephone visit.  Blood Pressure Weight BMI  BP Readings from Last 3 Encounters:  03/10/19 139/67  02/25/19 (!) 145/66  06/04/18 (!) 142/62   Wt Readings from Last 3 Encounters:  03/10/19 167 lb 6.4 oz (75.9 kg)  02/25/19 165 lb (74.8 kg)  06/04/18 164 lb (74.4 kg)   BMI Readings from Last 1 Encounters:  03/10/19 27.86 kg/m    *Unable to obtain current vital signs, weight, and BMI due to telephone visit type  Hearing/Vision  [place] did not seem to have difficulty with hearing/understanding during the telephone conversation Reports that she has had a formal eye exam by an eye care professional within the past year Reports that she has not had a formal hearing evaluation within the past year *Unable to  fully assess hearing and vision during telephone visit type  Cognitive Function: 6CIT Screen 02/26/2020  What Year? 0 points  What month? 0 points  What time? 0 points  Count back from 20 0 points  Months in reverse 0 points  Repeat phrase 0 points  Total Score 0   (Normal:0-7, Significant for Dysfunction: >8)  Normal Cognitive Function Screening: Yes   Immunization & Health Maintenance Record Immunization History  Administered Date(s) Administered   Influenza Split 09/24/2013   Influenza, High Dose Seasonal PF 09/23/2016, 02/20/2018   Influenza,inj,Quad PF,6+ Mos 10/28/2014, 12/03/2015   Influenza,inj,quad, With Preservative 01/06/2019   Pneumococcal Conjugate-13 12/03/2015   Pneumococcal Polysaccharide-23 02/20/2018   Tdap 10/28/2014    Health Maintenance  Topic Date Due   COLONOSCOPY  12/25/2008   INFLUENZA VACCINE  07/26/2019   MAMMOGRAM  03/03/2021   TETANUS/TDAP  10/28/2024   DEXA SCAN  Completed   Hepatitis C Screening  Completed   PNA vac Low Risk Adult  Completed       Assessment  This is a routine wellness examination for [employer].  Health Maintenance: Due or Overdue Health Maintenance Due  Topic Date Due   COLONOSCOPY  12/25/2008   INFLUENZA VACCINE  07/26/2019    Adriana Reams does not need a referral for Community Assistance: Care Management:   no Social Work:    no Prescription Assistance:  no Nutrition/Diabetes Education:  no   Plan:  Personalized Goals Goals Addressed             This Visit's Progress    Patient Stated       02/26/2020 AWV Goal: Keep All Scheduled Appointments  Over the next year, patient will attend all scheduled appointments with their PCP and any specialists that they see.        Personalized Health Maintenance & Screening Recommendations  Colorectal cancer screening  Lung Cancer Screening Recommended: yes (Low Dose CT Chest recommended if Age 43-80 years, 30 pack-year currently smoking OR have quit w/in  past 15 years) Hepatitis C Screening recommended: no  HIV Screening recommended: no  Advanced Directives: Written information was not prepared per patient's request.  Referrals & Orders No orders of the defined types were placed in this encounter.   Follow-up Plan Follow-up with Sharion Balloon, FNP as planned Schedule colon screening   I have personally reviewed and noted the following in the patient's chart:   Medical and social history Use of alcohol, tobacco or illicit drugs  Current medications and supplements Functional ability and status Nutritional status Physical activity Advanced directives List of other physicians Hospitalizations, surgeries, and ER visits in previous 12 months Vitals Screenings to include cognitive, depression, and falls Referrals and appointments  In addition, I have reviewed and discussed with Adriana Reams certain preventive protocols, quality metrics, and best practice recommendations. A written personalized care plan for preventive services as well as general preventive health recommendations is available and can be mailed to the patient at her request.      Oretha Milch [id number]  I have reviewed and agree with the above AWV documentation.    Evelina Dun, FNP

## 2020-03-01 ENCOUNTER — Other Ambulatory Visit: Payer: Self-pay | Admitting: Family

## 2020-03-01 DIAGNOSIS — J449 Chronic obstructive pulmonary disease, unspecified: Secondary | ICD-10-CM

## 2020-04-07 ENCOUNTER — Other Ambulatory Visit: Payer: Self-pay | Admitting: Family

## 2020-04-07 DIAGNOSIS — J449 Chronic obstructive pulmonary disease, unspecified: Secondary | ICD-10-CM

## 2020-04-07 MED ORDER — SPIRIVA RESPIMAT 2.5 MCG/ACT IN AERS: INHALATION_SPRAY | RESPIRATORY_TRACT | 0 refills | Status: DC

## 2020-04-07 MED ORDER — ALBUTEROL SULFATE HFA 108 (90 BASE) MCG/ACT IN AERS: INHALATION_SPRAY | RESPIRATORY_TRACT | 0 refills | Status: DC

## 2020-04-07 NOTE — Telephone Encounter (Signed)
.   Prescription Request  04/07/2020  What is the name of the medication or equipment? spriva and albuteral  Have you contacted your pharmacy to request a refill? (if applicable) yes and they told her she had to be seen . She mad appt on tues 04/20. She is out of meds  Which pharmacy would you like this sent to? [facility]   Patient notified that their request is being sent to the clinical staff for review and that they should receive a response within 2 business days.

## 2020-04-07 NOTE — Telephone Encounter (Signed)
[facility]. NTBS 30 days given 03/01/20 LOV 03/10/19

## 2020-04-07 NOTE — Telephone Encounter (Signed)
Meds sent

## 2020-04-13 ENCOUNTER — Ambulatory Visit (INDEPENDENT_AMBULATORY_CARE_PROVIDER_SITE_OTHER): Payer: Medicare Other | Admitting: Family

## 2020-04-13 ENCOUNTER — Encounter: Payer: Self-pay | Admitting: Family

## 2020-04-13 DIAGNOSIS — F17200 Nicotine dependence, unspecified, uncomplicated: Secondary | ICD-10-CM

## 2020-04-13 DIAGNOSIS — M8580 Other specified disorders of bone density and structure, unspecified site: Secondary | ICD-10-CM | POA: Diagnosis not present

## 2020-04-13 DIAGNOSIS — E785 Hyperlipidemia, unspecified: Secondary | ICD-10-CM

## 2020-04-13 DIAGNOSIS — J449 Chronic obstructive pulmonary disease, unspecified: Secondary | ICD-10-CM

## 2020-04-13 MED ORDER — ALBUTEROL SULFATE HFA 108 (90 BASE) MCG/ACT IN AERS: INHALATION_SPRAY | RESPIRATORY_TRACT | 2 refills | Status: DC

## 2020-04-13 MED ORDER — ATORVASTATIN CALCIUM 20 MG PO TABS: 20.0000 mg | ORAL_TABLET | Freq: Every day | ORAL | 3 refills | Status: DC

## 2020-04-13 MED ORDER — SPIRIVA RESPIMAT 2.5 MCG/ACT IN AERS: INHALATION_SPRAY | RESPIRATORY_TRACT | 11 refills | Status: DC

## 2020-04-13 NOTE — Progress Notes (Signed)
Virtual Visit via telephone Note Due to COVID-19 pandemic this visit was conducted virtually. This visit type was conducted due to national recommendations for restrictions regarding the COVID-19 Pandemic (e.g. social distancing, sheltering in place) in an effort to limit this patient's exposure and mitigate transmission in our community. All issues noted in this document were discussed and addressed.  A physical exam was not performed with this format.  I connected with Brianna [name] on 04/13/20 at 9:40 AM by telephone and verified that I am speaking with the correct person using two identifiers. Brianna [name] is currently located at home and no one is currently with her during visit. The provider, Evelina Dun, FNP is located in their office at time of visit.  I discussed the limitations, risks, security and privacy concerns of performing an evaluation and management service by telephone and the availability of in person appointments. I also discussed with the patient that there may be a patient responsible charge related to this service. The patient expressed understanding and agreed to proceed.   History and Present Illness: Pt calls the office  today for chronic follow up.  Hyperlipidemia This is a chronic problem. The current episode started more than 1 year ago. The problem is controlled. Recent lipid tests were reviewed and are normal. Exacerbating diseases include obesity. Current antihyperlipidemic treatment includes statins. The current treatment provides moderate improvement of lipids. Risk factors for coronary artery disease include dyslipidemia, a sedentary lifestyle and post-menopausal.  Nicotine Dependence Presents for follow-up visit. Her urge triggers include company of smokers. The symptoms have been stable. She smokes 1 pack of cigarettes per day.  COPD  Pt continues to smoke about a 1 pack a day. She reports using the Spirva daily and the albuterol daily.  Osteopenia Last Dexa  scan was 09/26/16. She takes calcium and vit D daily.    Review of Systems  All other systems reviewed and are negative.    Observations/Objective: No SOB or distress noted   Assessment and Plan: [name] comes in today with chief complaint of No chief complaint on file.   Diagnosis and orders addressed:  1. Chronic obstructive pulmonary disease, unspecified COPD type ([place]) - CMP14+EGFR; Future - CBC with Differential/Platelet; Future - albuterol (VENTOLIN HFA) 108 (90 Base) MCG/ACT inhaler; 2 PUFFS EVERY 6 HOURS AS NEEDED FOR WHEEZING OR SHORTNESS OF BREATH  Dispense: 8.5 g; Refill: 2 - Tiotropium Bromide Monohydrate (SPIRIVA RESPIMAT) 2.5 MCG/ACT AERS; INHALE 2 PUFFS INTO LUNGS DAILY  Dispense: 4 g; Refill: 11  2. Tobacco use disorder - CMP14+EGFR; Future - CBC with Differential/Platelet; Future  3. Osteopenia with high risk of fracture - CMP14+EGFR; Future - CBC with Differential/Platelet; Future - DG [facility] DEXA  4. Hyperlipidemia, unspecified hyperlipidemia type - CMP14+EGFR; Future - CBC with Differential/Platelet; Future - Lipid panel; Future - atorvastatin (LIPITOR) 20 MG tablet; Take 1 tablet (20 mg total) by mouth daily.  Dispense: 90 tablet; Refill: 3   Labs pending Health Maintenance reviewed Diet and exercise encouraged  Follow up plan: 1 year    I discussed the assessment and treatment plan with the patient. The patient was provided an opportunity to ask questions and all were answered. The patient agreed with the plan and demonstrated an understanding of the instructions.   The patient was advised to call back or seek an in-person evaluation if the symptoms worsen or if the condition fails to improve as anticipated.  The above assessment and management plan was discussed with the patient. The  patient verbalized understanding of and has agreed to the management plan. Patient is aware to call the clinic if symptoms persist or worsen. Patient is aware  when to return to the clinic for a follow-up visit. Patient educated on when it is appropriate to go to the emergency department.   Time call ended: 9:58 AM   I provided 18 minutes of non-face-to-face time during this encounter.    Evelina Dun, FNP

## 2020-09-17 ENCOUNTER — Other Ambulatory Visit: Payer: Self-pay | Admitting: Family

## 2020-09-17 DIAGNOSIS — J449 Chronic obstructive pulmonary disease, unspecified: Secondary | ICD-10-CM

## 2020-10-18 ENCOUNTER — Other Ambulatory Visit: Payer: Self-pay | Admitting: Family

## 2020-10-18 DIAGNOSIS — J449 Chronic obstructive pulmonary disease, unspecified: Secondary | ICD-10-CM

## 2020-11-16 ENCOUNTER — Other Ambulatory Visit: Payer: Self-pay | Admitting: Family

## 2020-11-16 DIAGNOSIS — J449 Chronic obstructive pulmonary disease, unspecified: Secondary | ICD-10-CM

## 2020-11-16 NOTE — Telephone Encounter (Signed)
[facility]. NTBS 30 days given 10/18/20

## 2020-11-23 ENCOUNTER — Other Ambulatory Visit: Payer: Self-pay

## 2020-11-23 ENCOUNTER — Encounter: Payer: Self-pay | Admitting: Family

## 2020-11-23 ENCOUNTER — Ambulatory Visit (INDEPENDENT_AMBULATORY_CARE_PROVIDER_SITE_OTHER): Payer: Medicare Other | Admitting: Family

## 2020-11-23 ENCOUNTER — Ambulatory Visit (INDEPENDENT_AMBULATORY_CARE_PROVIDER_SITE_OTHER): Payer: Medicare Other

## 2020-11-23 VITALS — BP 156/69 | HR 67 | Temp 97.1°F | Ht 65.0 in | Wt 165.0 lb

## 2020-11-23 DIAGNOSIS — M8588 Other specified disorders of bone density and structure, other site: Secondary | ICD-10-CM | POA: Diagnosis not present

## 2020-11-23 DIAGNOSIS — Z23 Encounter for immunization: Secondary | ICD-10-CM | POA: Diagnosis not present

## 2020-11-23 DIAGNOSIS — Z6829 Body mass index (BMI) 29.0-29.9, adult: Secondary | ICD-10-CM

## 2020-11-23 DIAGNOSIS — J449 Chronic obstructive pulmonary disease, unspecified: Secondary | ICD-10-CM | POA: Diagnosis not present

## 2020-11-23 DIAGNOSIS — M8580 Other specified disorders of bone density and structure, unspecified site: Secondary | ICD-10-CM

## 2020-11-23 DIAGNOSIS — F17200 Nicotine dependence, unspecified, uncomplicated: Secondary | ICD-10-CM

## 2020-11-23 DIAGNOSIS — E785 Hyperlipidemia, unspecified: Secondary | ICD-10-CM | POA: Diagnosis not present

## 2020-11-23 DIAGNOSIS — R030 Elevated blood-pressure reading, without diagnosis of hypertension: Secondary | ICD-10-CM

## 2020-11-23 LAB — CMP14+EGFR
ALT: 17 IU/L (ref 0–32)
AST: 17 IU/L (ref 0–40)
Albumin/Globulin Ratio: 1.9 (ref 1.2–2.2)
Albumin: 4.4 g/dL (ref 3.8–4.8)
Alkaline Phosphatase: 85 IU/L (ref 44–121)
BUN/Creatinine Ratio: 13 (ref 12–28)
BUN: 9 mg/dL (ref 8–27)
Bilirubin Total: 0.6 mg/dL (ref 0.0–1.2)
CO2: 23 mmol/L (ref 20–29)
Calcium: 10 mg/dL (ref 8.7–10.3)
Chloride: 102 mmol/L (ref 96–106)
Creatinine, Ser: 0.68 mg/dL (ref 0.57–1.00)
GFR calc Af Amer: 102 mL/min/{1.73_m2} (ref >59)
GFR calc non Af Amer: 89 mL/min/{1.73_m2} (ref >59)
Globulin, Total: 2.3 g/dL (ref 1.5–4.5)
Glucose: 85 mg/dL (ref 65–99)
Potassium: 4.4 mmol/L (ref 3.5–5.2)
Sodium: 140 mmol/L (ref 134–144)
Total Protein: 6.7 g/dL (ref 6.0–8.5)

## 2020-11-23 LAB — CBC WITH DIFFERENTIAL/PLATELET
Basophils Absolute: 0.1 10*3/uL (ref 0.0–0.2)
Basos: 1 %
EOS (ABSOLUTE): 0.3 10*3/uL (ref 0.0–0.4)
Eos: 3 %
Hematocrit: 45.3 % (ref 34.0–46.6)
Hemoglobin: 15.2 g/dL (ref 11.1–15.9)
Immature Grans (Abs): 0 10*3/uL (ref 0.0–0.1)
Immature Granulocytes: 0 %
Lymphocytes Absolute: 3.7 10*3/uL — ABNORMAL HIGH (ref 0.7–3.1)
Lymphs: 39 %
MCH: 30.5 pg (ref 26.6–33.0)
MCHC: 33.6 g/dL (ref 31.5–35.7)
MCV: 91 fL (ref 79–97)
Monocytes Absolute: 0.7 10*3/uL (ref 0.1–0.9)
Monocytes: 7 %
Neutrophils Absolute: 5 10*3/uL (ref 1.4–7.0)
Neutrophils: 50 %
Platelets: 310 10*3/uL (ref 150–450)
RBC: 4.98 x10E6/uL (ref 3.77–5.28)
RDW: 12.1 % (ref 11.7–15.4)
WBC: 9.7 10*3/uL (ref 3.4–10.8)

## 2020-11-23 LAB — LIPID PANEL
Chol/HDL Ratio: 2 ratio (ref 0.0–4.4)
Cholesterol, Total: 153 mg/dL (ref 100–199)
HDL: 78 mg/dL (ref >39)
LDL Chol Calc (NIH): 60 mg/dL (ref 0–99)
Triglycerides: 77 mg/dL (ref 0–149)
VLDL Cholesterol Cal: 15 mg/dL (ref 5–40)

## 2020-11-23 MED ORDER — SPIRIVA RESPIMAT 2.5 MCG/ACT IN AERS: INHALATION_SPRAY | RESPIRATORY_TRACT | 11 refills | Status: DC

## 2020-11-23 MED ORDER — ALBUTEROL SULFATE HFA 108 (90 BASE) MCG/ACT IN AERS: INHALATION_SPRAY | RESPIRATORY_TRACT | 0 refills | Status: DC

## 2020-11-23 MED ORDER — ATORVASTATIN CALCIUM 20 MG PO TABS: 20.0000 mg | ORAL_TABLET | Freq: Every day | ORAL | 3 refills | Status: DC

## 2020-11-23 NOTE — Patient Instructions (Signed)
Osteopenia  Osteopenia is a loss of thickness (density) inside of the bones. Another name for osteopenia is low bone mass. Mild osteopenia is a normal part of aging. It is not a disease, and it does not cause symptoms. However, if you have osteopenia and continue to lose bone mass, you could develop a condition that causes the bones to become thin and break more easily (osteoporosis). You may also lose some height, have back pain, and have a stooped posture. Although osteopenia is not a disease, making changes to your lifestyle and diet can help to prevent osteopenia from developing into osteoporosis. What are the causes? Osteopenia is caused by loss of calcium in the bones.  Bones are constantly changing. Old bone cells are continually being replaced with new bone cells. This process builds new bone. The mineral calcium is needed to build new bone and maintain bone density. Bone density is usually highest around age 35. After that, most people's bodies cannot replace all the bone they have lost with new bone. What increases the risk? You are more likely to develop this condition if:  You are older than age 50.  You are a woman who went through menopause early.  You have a long illness that keeps you in bed.  You do not get enough exercise.  You lack certain nutrients (malnutrition).  You have an overactive thyroid gland (hyperthyroidism).  You smoke.  You drink a lot of alcohol.  You are taking medicines that weaken the bones, such as steroids. What are the signs or symptoms? This condition does not cause any symptoms. You may have a slightly higher risk for bone breaks (fractures), so getting fractures more easily than normal may be an indication of osteopenia. How is this diagnosed? Your health care provider can diagnose this condition with a special type of X-ray exam that measures bone density (dual-energy X-ray absorptiometry, DEXA). This test can measure bone density in your  hips, spine, and wrists. Osteopenia has no symptoms, so this condition is usually diagnosed after a routine bone density screening test is done for osteoporosis. This routine screening is usually done for:  Women who are age 65 or older.  Men who are age 70 or older. If you have risk factors for osteopenia, you may have the screening test at an earlier age. How is this treated? Making dietary and lifestyle changes can lower your risk for osteoporosis. If you have severe osteopenia that is close to becoming osteoporosis, your health care provider may prescribe medicines and dietary supplements such as calcium and vitamin D. These supplements help to rebuild bone density. Follow these instructions at home:   Take over-the-counter and prescription medicines only as told by your health care provider. These include vitamins and supplements.  Eat a diet that is high in calcium and vitamin D. ? Calcium is found in dairy products, beans, salmon, and leafy green vegetables like spinach and broccoli. ? Look for foods that have vitamin D and calcium added to them (fortified foods), such as orange juice, cereal, and bread.  Do 30 or more minutes of a weight-bearing exercise every day, such as walking, jogging, or playing a sport. These types of exercises strengthen the bones.  Take precautions at home to lower your risk of falling, such as: ? Keeping rooms well-lit and free of clutter, such as cords. ? Installing safety rails on stairs. ? Using rubber mats in the bathroom or other areas that are often wet or slippery.  Do not use   any products that contain nicotine or tobacco, such as cigarettes and e-cigarettes. If you need help quitting, ask your health care provider.  Avoid alcohol or limit alcohol intake to no more than 1 drink a day for nonpregnant women and 2 drinks a day for men. One drink equals 12 oz of beer, 5 oz of wine, or 1 oz of hard liquor.  Keep all follow-up visits as told by your  health care provider. This is important. Contact a health care provider if:  You have not had a bone density screening for osteoporosis and you are: ? A woman, age 65 or older. ? A man, age 70 or older.  You are a postmenopausal woman who has not had a bone density screening for osteoporosis.  You are older than age 50 and you want to know if you should have bone density screening for osteoporosis. Summary  Osteopenia is a loss of thickness (density) inside of the bones. Another name for osteopenia is low bone mass.  Osteopenia is not a disease, but it may increase your risk for a condition that causes the bones to become thin and break more easily (osteoporosis).  You may be at risk for osteopenia if you are older than age 50 or if you are a woman who went through early menopause.  Osteopenia does not cause any symptoms, but it can be diagnosed with a bone density screening test.  Dietary and lifestyle changes are the first treatment for osteopenia. These may lower your risk for osteoporosis. This information is not intended to replace advice given to you by your health care provider. Make sure you discuss any questions you have with your health care provider. Document Revised: 11/23/2017 Document Reviewed: 09/19/2017 Elsevier Patient Education  2020 [employer]  

## 2020-11-23 NOTE — Progress Notes (Signed)
 Subjective:    Patient ID: Brianna [name], female    DOB: 05/14/1951, 69 y.o.   MRN: [id number]  Chief Complaint  Patient presents with  . Medical Management of Chronic Issues    med refill   . COPD   Pt presents to the office  today for chronic follow up. Pt's BP is elevated today. She does not monitor her BP at home.  COPD She complains of cough, shortness of breath and wheezing. This is a chronic problem. The current episode started more than 1 year ago. The problem occurs intermittently. The problem has been waxing and waning. The cough is non-productive and barking. Her past medical history is significant for COPD.  Hyperlipidemia This is a chronic problem. The current episode started more than 1 year ago. The problem is controlled. Recent lipid tests were reviewed and are normal. Associated symptoms include shortness of breath. Current antihyperlipidemic treatment includes statins. The current treatment provides moderate improvement of lipids. Risk factors for coronary artery disease include dyslipidemia, hypertension, a sedentary lifestyle and post-menopausal.  Nicotine Dependence Presents for follow-up visit. Her urge triggers include company of smokers. The symptoms have been stable. She smokes 1 pack of cigarettes per day.  Osteopenia Pt's last Dexa scan was 09/26/16. She takes calcium 600 mg  and Vit D 400 mg  daily.     Review of Systems  Respiratory: Positive for cough, shortness of breath and wheezing.   All other systems reviewed and are negative.      Objective:   Physical Exam Vitals reviewed.  Constitutional:      General: She is not in acute distress.    Appearance: She is well-developed.  HENT:     Head: Normocephalic and atraumatic.     Right Ear: Tympanic membrane normal.     Left Ear: Tympanic membrane normal.  Eyes:     Pupils: Pupils are equal, round, and reactive to light.  Neck:     Thyroid: No thyromegaly.  Cardiovascular:     Rate and Rhythm:  Normal rate and regular rhythm.     Heart sounds: Normal heart sounds. No murmur heard.   Pulmonary:     Effort: Pulmonary effort is normal. No respiratory distress.     Breath sounds: Normal breath sounds. No wheezing.  Abdominal:     General: Bowel sounds are normal. There is no distension.     Palpations: Abdomen is soft.     Tenderness: There is no abdominal tenderness.  Musculoskeletal:        General: No tenderness. Normal range of motion.     Cervical back: Normal range of motion and neck supple.  Skin:    General: Skin is warm and dry.  Neurological:     Mental Status: She is alert and oriented to person, place, and time.     Cranial Nerves: No cranial nerve deficit.     Deep Tendon Reflexes: Reflexes are normal and symmetric.  Psychiatric:        Behavior: Behavior normal.        Thought Content: Thought content normal.        Judgment: Judgment normal.       BP (!) 171/72   Pulse 72   Temp (!) 97.1 F (36.2 C) (Temporal)   Ht 5' 5" (1.651 m)   Wt 165 lb (74.8 kg)   SpO2 98%   BMI 27.46 kg/m      Assessment & Plan:  [name] comes in today with   chief complaint of Medical Management of Chronic Issues (med refill ) and COPD   Diagnosis and orders addressed:  1. Chronic obstructive pulmonary disease, unspecified COPD type ([facility]) - Tiotropium Bromide Monohydrate (SPIRIVA RESPIMAT) 2.5 MCG/ACT AERS; INHALE 2 PUFFS INTO LUNGS DAILY  Dispense: 4 g; Refill: 11 - albuterol (VENTOLIN HFA) 108 (90 Base) MCG/ACT inhaler; 2 PUFFS EVERY 6 HOURS AS NEEDED FOR WHEEZING OR SHORTNESS OF BREATH (Needs to be seen before next refill)  Dispense: 8.5 g; Refill: 0 - CMP14+EGFR - CBC with Differential/Platelet  2. Hyperlipidemia, unspecified hyperlipidemia type - atorvastatin (LIPITOR) 20 MG tablet; Take 1 tablet (20 mg total) by mouth daily.  Dispense: 90 tablet; Refill: 3 - CMP14+EGFR - CBC with Differential/Platelet - Lipid panel  3. Need for immunization against  influenza - Flu Vaccine QUAD High Dose(Fluad) - CMP14+EGFR - CBC with Differential/Platelet  4. Osteopenia with high risk of fracture - DG [facility] DEXA - CMP14+EGFR - CBC with Differential/Platelet  5. BMI 29.0-29.9,adult - CMP14+EGFR - CBC with Differential/Platelet  6. Tobacco use disorder Smoking cessation discussed - CMP14+EGFR - CBC with Differential/Platelet  7. Elevated blood pressure reading Pt will monitor BP at home RTO in 3 month if >140/90 will start medication    Labs pending Health Maintenance reviewed Diet and exercise encouraged  Follow up plan: 3 months   Christy Hawks, FNP  

## 2020-11-24 DIAGNOSIS — M810 Age-related osteoporosis without current pathological fracture: Secondary | ICD-10-CM | POA: Diagnosis not present

## 2020-11-24 DIAGNOSIS — Z780 Asymptomatic menopausal state: Secondary | ICD-10-CM | POA: Diagnosis not present

## 2020-12-02 DIAGNOSIS — R300 Dysuria: Secondary | ICD-10-CM | POA: Diagnosis not present

## 2020-12-02 DIAGNOSIS — M545 Low back pain, unspecified: Secondary | ICD-10-CM | POA: Diagnosis not present

## 2020-12-02 DIAGNOSIS — N3001 Acute cystitis with hematuria: Secondary | ICD-10-CM | POA: Diagnosis not present

## 2020-12-03 ENCOUNTER — Ambulatory Visit: Payer: Medicare Other | Admitting: Family Medicine

## 2020-12-22 ENCOUNTER — Other Ambulatory Visit: Payer: Self-pay | Admitting: Family

## 2020-12-22 DIAGNOSIS — J449 Chronic obstructive pulmonary disease, unspecified: Secondary | ICD-10-CM

## 2021-01-25 ENCOUNTER — Other Ambulatory Visit: Payer: Self-pay | Admitting: Family

## 2021-01-25 DIAGNOSIS — J449 Chronic obstructive pulmonary disease, unspecified: Secondary | ICD-10-CM

## 2021-02-22 ENCOUNTER — Ambulatory Visit (INDEPENDENT_AMBULATORY_CARE_PROVIDER_SITE_OTHER): Payer: Medicare Other | Admitting: Family

## 2021-02-22 ENCOUNTER — Other Ambulatory Visit: Payer: Self-pay

## 2021-02-22 ENCOUNTER — Ambulatory Visit: Payer: Medicare Other

## 2021-02-22 ENCOUNTER — Encounter: Payer: Self-pay | Admitting: Family

## 2021-02-22 VITALS — BP 146/63 | HR 71 | Temp 97.6°F | Ht 65.0 in | Wt 165.4 lb

## 2021-02-22 DIAGNOSIS — I10 Essential (primary) hypertension: Secondary | ICD-10-CM

## 2021-02-22 DIAGNOSIS — E663 Overweight: Secondary | ICD-10-CM

## 2021-02-22 DIAGNOSIS — J449 Chronic obstructive pulmonary disease, unspecified: Secondary | ICD-10-CM

## 2021-02-22 DIAGNOSIS — F17200 Nicotine dependence, unspecified, uncomplicated: Secondary | ICD-10-CM | POA: Diagnosis not present

## 2021-02-22 DIAGNOSIS — E785 Hyperlipidemia, unspecified: Secondary | ICD-10-CM

## 2021-02-22 MED ORDER — LISINOPRIL 10 MG PO TABS: 10.0000 mg | ORAL_TABLET | Freq: Every day | ORAL | 3 refills | Status: DC

## 2021-02-22 NOTE — Progress Notes (Signed)
Subjective:    Patient ID: Brianna [name], female    DOB: 1951-03-18, 70 y.o.   MRN: [id number]  Chief Complaint  Patient presents with  . Hypertension   Pt presents to the office today for chronic follow up. Pt's BP is elevated today. She does not monitor her BP at home.  Hypertension This is a chronic problem. The current episode started more than 1 year ago. The problem has been waxing and waning since onset. The problem is uncontrolled. Pertinent negatives include no malaise/fatigue, peripheral edema or shortness of breath. Risk factors for coronary artery disease include dyslipidemia, obesity and sedentary lifestyle. The current treatment provides moderate improvement.  Nicotine Dependence Presents for follow-up visit. Her urge triggers include company of smokers. The symptoms have been stable. She smokes 1 pack of cigarettes per day.  Hyperlipidemia This is a chronic problem. The current episode started more than 1 year ago. The problem is controlled. Recent lipid tests were reviewed and are normal. Pertinent negatives include no shortness of breath. Current antihyperlipidemic treatment includes statins. The current treatment provides moderate improvement of lipids. Risk factors for coronary artery disease include dyslipidemia, hypertension, a sedentary lifestyle and post-menopausal.      Review of Systems  Constitutional: Negative for malaise/fatigue.  Respiratory: Negative for shortness of breath.   All other systems reviewed and are negative.      Objective:   Physical Exam Vitals reviewed.  Constitutional:      General: She is not in acute distress.    Appearance: She is well-developed and well-nourished.  HENT:     Head: Normocephalic and atraumatic.     Right Ear: Tympanic membrane normal.     Left Ear: Tympanic membrane normal.     Mouth/Throat:     Mouth: Oropharynx is clear and moist.  Eyes:     Pupils: Pupils are equal, round, and reactive to light.  Neck:      Thyroid: No thyromegaly.  Cardiovascular:     Rate and Rhythm: Normal rate and regular rhythm.     Pulses: Intact distal pulses.     Heart sounds: Normal heart sounds. No murmur heard.   Pulmonary:     Effort: Pulmonary effort is normal. No respiratory distress.     Breath sounds: Normal breath sounds. No wheezing.  Abdominal:     General: Bowel sounds are normal. There is no distension.     Palpations: Abdomen is soft.     Tenderness: There is no abdominal tenderness.  Musculoskeletal:        General: No tenderness or edema. Normal range of motion.     Cervical back: Normal range of motion and neck supple.  Skin:    General: Skin is warm and dry.  Neurological:     Mental Status: She is alert and oriented to person, place, and time.     Cranial Nerves: No cranial nerve deficit.     Deep Tendon Reflexes: Reflexes are normal and symmetric.  Psychiatric:        Mood and Affect: Mood and affect normal.        Behavior: Behavior normal.        Thought Content: Thought content normal.        Judgment: Judgment normal.          BP (!) 146/63   Pulse 71   Temp 97.6 F (36.4 C) (Temporal)   Ht 5\' 5"  (1.651 m)   Wt 165 lb 6.4 oz (75 kg)  SpO2 98%   BMI 27.52 kg/m   Assessment & Plan:  Brianna [name] comes in today with chief complaint of Hypertension   Diagnosis and orders addressed:  1. Chronic obstructive pulmonary disease, unspecified COPD type ([place])  2. Tobacco use disorder  3. Overweight (BMI 25.0-29.9)  4. Hyperlipidemia, unspecified hyperlipidemia type  5. Hypertension, unspecified type Will start Lisinopril 10 mg today -Daily blood pressure log given with instructions on how to fill out and told to bring to next visit -Dash diet information given -Exercise encouraged - Stress Management  -Continue current meds -RTO in 2 weeks  - lisinopril (ZESTRIL) 10 MG tablet; Take 1 tablet (10 mg total) by mouth daily.  Dispense: 90 tablet; Refill: 3   Labs  pending Health Maintenance reviewed Diet and exercise encouraged  Follow up plan: 2 weeks to recheck HTN   Evelina Dun, FNP

## 2021-02-22 NOTE — Patient Instructions (Signed)

## 2021-03-10 ENCOUNTER — Other Ambulatory Visit: Payer: Self-pay

## 2021-03-10 ENCOUNTER — Ambulatory Visit: Payer: Medicare Other

## 2021-03-10 ENCOUNTER — Encounter: Payer: Self-pay | Admitting: Family

## 2021-03-10 ENCOUNTER — Ambulatory Visit (INDEPENDENT_AMBULATORY_CARE_PROVIDER_SITE_OTHER): Payer: Medicare Other | Admitting: Family

## 2021-03-10 VITALS — BP 139/52 | HR 71 | Temp 97.0°F | Ht 65.0 in | Wt 164.6 lb

## 2021-03-10 DIAGNOSIS — I10 Essential (primary) hypertension: Secondary | ICD-10-CM | POA: Diagnosis not present

## 2021-03-10 NOTE — Progress Notes (Signed)
   Subjective:    Patient ID: Brianna [name], female    DOB: 1951/11/03, 70 y.o.   MRN: [id number]  Chief Complaint  Patient presents with  . Hypertension   PT presents to the office today to recheck HTN. She was seen on 02/22/21 and started on Lisinopril 10 mg. Her BP is at goal today! Hypertension This is a chronic problem. The current episode started more than 1 year ago. The problem has been resolved since onset. The problem is controlled. Pertinent negatives include no malaise/fatigue, peripheral edema or shortness of breath. Risk factors for coronary artery disease include dyslipidemia, obesity, sedentary lifestyle and smoking/tobacco exposure. The current treatment provides moderate improvement.      Review of Systems  Constitutional: Negative for malaise/fatigue.  Respiratory: Negative for shortness of breath.   All other systems reviewed and are negative.      Objective:   Physical Exam Vitals reviewed.  Constitutional:      General: She is not in acute distress.    Appearance: She is well-developed.  HENT:     Head: Normocephalic and atraumatic.     Right Ear: Tympanic membrane normal.     Left Ear: Tympanic membrane normal.  Eyes:     Pupils: Pupils are equal, round, and reactive to light.  Neck:     Thyroid: No thyromegaly.  Cardiovascular:     Rate and Rhythm: Normal rate and regular rhythm.     Heart sounds: Normal heart sounds. No murmur heard.   Pulmonary:     Effort: Pulmonary effort is normal. No respiratory distress.     Breath sounds: Normal breath sounds. No wheezing.  Abdominal:     General: Bowel sounds are normal. There is no distension.     Palpations: Abdomen is soft.     Tenderness: There is no abdominal tenderness.  Musculoskeletal:        General: No tenderness. Normal range of motion.     Cervical back: Normal range of motion and neck supple.  Skin:    General: Skin is warm and dry.  Neurological:     Mental Status: She is alert and  oriented to person, place, and time.     Cranial Nerves: No cranial nerve deficit.     Deep Tendon Reflexes: Reflexes are normal and symmetric.  Psychiatric:        Behavior: Behavior normal.        Thought Content: Thought content normal.        Judgment: Judgment normal.       BP (!) 139/52   Pulse 71   Temp (!) 97 F (36.1 C) (Temporal)   Ht _0  (1.651 m)   Wt 164 lb 9.6 oz (74.7 kg)   BMI 27.39 kg/m      Assessment & Plan:  Brianna [name] comes in today with chief complaint of Hypertension   Diagnosis and orders addressed:  1. Primary hypertension At goal today! Continue Lisinopril 10 mg -Dash diet information given -Exercise encouraged - Stress Management  -RTO in 6 months  - BMP8+EGFR    Evelina Dun, FNP

## 2021-03-10 NOTE — Patient Instructions (Signed)

## 2021-03-11 LAB — BMP8+EGFR
BUN/Creatinine Ratio: 17 (ref 12–28)
BUN: 11 mg/dL (ref 8–27)
CO2: 24 mmol/L (ref 20–29)
Calcium: 9.9 mg/dL (ref 8.7–10.3)
Chloride: 101 mmol/L (ref 96–106)
Creatinine, Ser: 0.65 mg/dL (ref 0.57–1.00)
Glucose: 126 mg/dL — ABNORMAL HIGH (ref 65–99)
Potassium: 4.5 mmol/L (ref 3.5–5.2)
Sodium: 139 mmol/L (ref 134–144)
eGFR: 94 mL/min/{1.73_m2} (ref >59)

## 2021-04-06 ENCOUNTER — Ambulatory Visit (INDEPENDENT_AMBULATORY_CARE_PROVIDER_SITE_OTHER): Payer: Medicare Other

## 2021-04-06 VITALS — Ht 65.0 in | Wt 164.0 lb

## 2021-04-06 DIAGNOSIS — Z1231 Encounter for screening mammogram for malignant neoplasm of breast: Secondary | ICD-10-CM | POA: Diagnosis not present

## 2021-04-06 DIAGNOSIS — Z0000 Encounter for general adult medical examination without abnormal findings: Secondary | ICD-10-CM

## 2021-04-06 NOTE — Progress Notes (Signed)
Subjective:   Caitlyne Ingham is a 70 y.o. female who presents for Medicare Annual (Subsequent) preventive examination.  Virtual Visit via Telephone Note  I connected with  Merriel Zinger on 04/06/21 at  4:15 PM EDT by telephone and verified that I am speaking with the correct person using two identifiers.  Location: Patient: Home Provider: [facility] Persons participating in the virtual visit: patient/Nurse Health Advisor   I discussed the limitations, risks, security and privacy concerns of performing an evaluation and management service by telephone and the availability of in person appointments. The patient expressed understanding and agreed to proceed.  Interactive audio and video telecommunications were attempted between this nurse and patient, however failed, due to patient having technical difficulties OR patient did not have access to video capability.  We continued and completed visit with audio only.  Some vital signs may be absent or patient reported.   Leveta Wahab E Dequane Strahan, LPN   Review of Systems     Cardiac Risk Factors include: advanced age (>9men, >75 women);smoking/ tobacco exposure;sedentary lifestyle;hypertension;dyslipidemia;Other (see comment), Risk factor comments: COPD     Objective:    Today's Vitals   04/06/21 1545  Weight: 164 lb (74.4 kg)  Height: 5\' 5"  (1.651 m)   Body mass index is 27.29 kg/m.  Advanced Directives 04/06/2021 02/26/2020 02/25/2019 05/16/2016  Does Patient Have a Medical Advance Directive? No No No No  Would patient like information on creating a medical advance directive? Yes (MAU/Ambulatory/Procedural Areas - Information given) No - Patient declined Yes (MAU/Ambulatory/Procedural Areas - Information given) Yes - Educational materials given    Current Medications (verified) Outpatient Encounter Medications as of 04/06/2021  Medication Sig  . albuterol (VENTOLIN HFA) 108 (90 Base) MCG/ACT inhaler 2 PUFFS EVERY 6 HOURS AS NEEDED FOR WHEEZING OR  SHORTNESS OF BREATH  . atorvastatin (LIPITOR) 20 MG tablet Take 1 tablet (20 mg total) by mouth daily.  . Calcium Carbonate-Vitamin D (CALCIUM 600+D) 600-400 MG-UNIT tablet Take 1 tablet by mouth daily.  . Garlic 10 MG CAPS Take by mouth.  Marland Kitchen lisinopril (ZESTRIL) 10 MG tablet Take 1 tablet (10 mg total) by mouth daily.  . Tiotropium Bromide Monohydrate (SPIRIVA RESPIMAT) 2.5 MCG/ACT AERS INHALE 2 PUFFS INTO LUNGS DAILY   No facility-administered encounter medications on file as of 04/06/2021.    Allergies (verified) Patient has no known allergies.   History: Past Medical History:  Diagnosis Date  . Cataract    Past Surgical History:  Procedure Laterality Date  . BREAST LUMPECTOMY Left 90's  . TUBAL LIGATION     Family History  Problem Relation Age of Onset  . Heart disease Mother   . Osteoporosis Mother   . Hypertension Mother   . Cancer Father        lung  . Diabetes Father   . Cancer Sister        brain  . Diabetes Sister   . Cancer Brother        brain  . Osteoporosis Sister   . Cancer Sister        breast  . Diabetes Brother   . Diabetes Brother    Social History   Socioeconomic History  . Marital status: Married    Spouse name: [name]  . Number of children: 2  . Years of education: Not on file  . Highest education level: Bachelor's degree (e.g., BA, AB, BS)  Occupational History  . Occupation: Retired    Fish farm manager: [employer]    Comment: [place]  Tobacco Use  . Smoking status: Heavy Tobacco Smoker    Packs/day: 1.50    Years: 46.00    Pack years: 69.00  . Smokeless tobacco: Never Used  Vaping Use  . Vaping Use: Never used  Substance and Sexual Activity  . Alcohol use: Yes    Comment: occasional  . Drug use: No  . Sexual activity: Not Currently  Other Topics Concern  . Not on file  Social History Narrative   Susann is retired and lives with husband [name]. They have 2 children that they see regularly as well as grandchildren. She likes to  read and spend time with the grandchildren. They have a dog that she walks multiple times per day for exercise.    Social Determinants of Health   Financial Resource Strain: Low Risk   . Difficulty of Paying Living Expenses: Not hard at all  Food Insecurity: No Food Insecurity  . Worried About Charity fundraiser in the Last Year: Never true  . Ran Out of Food in the Last Year: Never true  Transportation Needs: No Transportation Needs  . Lack of Transportation (Medical): No  . Lack of Transportation (Non-Medical): No  Physical Activity: Insufficiently Active  . Days of Exercise per Week: 7 days  . Minutes of Exercise per Session: 20 min  Stress: No Stress Concern Present  . Feeling of Stress : Not at all  Social Connections: Moderately Isolated  . Frequency of Communication with Friends and Family: More than three times a week  . Frequency of Social Gatherings with Friends and Family: Twice a week  . Attends Religious Services: Never  . Active Member of Clubs or Organizations: No  . Attends Archivist Meetings: Never  . Marital Status: Married    Tobacco Counseling Ready to quit: No Counseling given: Yes   Clinical Intake:  Pre-visit preparation completed: Yes  Pain : No/denies pain     BMI - recorded: 27.29 Nutritional Status: BMI 25 -29 Overweight Nutritional Risks: None Diabetes: No  How often do you need to have someone help you when you read instructions, pamphlets, or other written materials from your doctor or pharmacy?: 1 - Never  Diabetic? No  Interpreter Needed?: No  Information entered by :: Sumire Halbleib, LPN   Activities of Daily Living In your present state of health, do you have any difficulty performing the following activities: 04/06/2021  Hearing? Y  Vision? N  Difficulty concentrating or making decisions? N  Walking or climbing stairs? N  Dressing or bathing? N  Doing errands, shopping? N  Preparing Food and eating ? N  Using the  Toilet? N  In the past six months, have you accidently leaked urine? Y  Comment wears pads  Do you have problems with loss of bowel control? N  Managing your Medications? N  Managing your Finances? N  Housekeeping or managing your Housekeeping? N  Some recent data might be hidden    Patient Care Team: Sharion Balloon, FNP as PCP - General (Family Medicine) Leticia Clas, OD (Optometry)  Indicate any recent Medical Services you may have received from other than [facility] providers in the past year (date may be approximate).     Assessment:   This is a routine wellness examination for Shaquisha.  Hearing/Vision screen  Hearing Screening   125Hz  250Hz  500Hz  1000Hz  2000Hz  3000Hz  4000Hz  6000Hz  8000Hz   Right ear:           Left ear:  Comments: C/o moderate hearing loss - declines hearing aids  Vision Screening Comments: Annual visits with [facility] - up to date - wears glasses  Dietary issues and exercise activities discussed: Current Exercise Habits: Home exercise routine, Type of exercise: walking, Time (Minutes): 20, Frequency (Times/Week): 7, Weekly Exercise (Minutes/Week): 140, Intensity: Mild, Exercise limited by: respiratory conditions(s)  Goals    .  Exercise 3x per week (30 min per time)      She would like to get more active Goals Addressed             This Visit's Progress   . Exercise 3x per week (30 min per time)       She would like to get more active    . Patient Stated   On track    02/26/2020 AWV Goal: Keep All Scheduled Appointments  Over the next year, patient will attend all scheduled appointments with their PCP and any specialists that they see.     . Quit Smoking             .  Patient Stated      02/26/2020 AWV Goal: Keep All Scheduled Appointments  Over the next year, patient will attend all scheduled appointments with their PCP and any specialists that they see.     .  Quit Smoking    .  Weight (lb) < 155 lb (70.3 kg)  (pt-stated)      Decrease portion sizes, and fried foods.       Depression Screen PHQ 2/9 Scores 04/06/2021 03/10/2021 02/22/2021 11/23/2020 02/26/2020 03/10/2019 02/25/2019  PHQ - 2 Score 0 0 0 0 0 0 0    Fall Risk Fall Risk  04/06/2021 03/10/2021 02/22/2021 11/23/2020 02/26/2020  Falls in the past year? 0 0 0 0 0  Number falls in past yr: 0 - - - -  Injury with Fall? 0 - - - -  Follow up Falls prevention discussed - - - -    FALL RISK PREVENTION PERTAINING TO THE HOME:  Any stairs in or around the home? No  If so, are there any without handrails? No  Home free of loose throw rugs in walkways, pet beds, electrical cords, etc? Yes  Adequate lighting in your home to reduce risk of falls? Yes   ASSISTIVE DEVICES UTILIZED TO PREVENT FALLS:  Life alert? No  Use of a cane, walker or w/c? No  Grab bars in the bathroom? Yes  Shower chair or bench in shower? Yes  Elevated toilet seat or a handicapped toilet? Yes   TIMED UP AND GO:  Was the test performed? No . Telephonic visit  Cognitive Function: Normal cognitive status assessed by direct observation by this Nurse Health Advisor. No abnormalities found.   MMSE - Mini Mental State Exam 02/25/2019  Orientation to time 5  Orientation to Place 5  Registration 3  Attention/ Calculation 5  Recall 3  Language- name 2 objects 2  Language- repeat 1  Language- follow 3 step command 3  Language- read & follow direction 1  Write a sentence 1  Copy design 1  Total score 30     6CIT Screen 02/26/2020  What Year? 0 points  What month? 0 points  What time? 0 points  Count back from 20 0 points  Months in reverse 0 points  Repeat phrase 0 points  Total Score 0    Immunizations Immunization History  Administered Date(s) Administered  . Fluad Quad(high  Dose 65+) 11/23/2020  . Influenza Split 09/24/2013  . Influenza, High Dose Seasonal PF 09/23/2016, 02/20/2018  . Influenza,inj,Quad PF,6+ Mos 10/28/2014, 12/03/2015  . Influenza,inj,quad, With  Preservative 01/06/2019  . Moderna Sars-Covid-2 Vaccination 03/25/2020, 04/22/2020  . Pneumococcal Conjugate-13 12/03/2015  . Pneumococcal Polysaccharide-23 02/20/2018  . Tdap 10/28/2014    TDAP status: Up to date  Flu Vaccine status: Up to date  Pneumococcal vaccine status: Up to date  Covid-19 vaccine status: Information provided on how to obtain vaccines.   Qualifies for Shingles Vaccine? Yes   Zostavax completed No   Shingrix Completed?: No.    Education has been provided regarding the importance of this vaccine. Patient has been advised to call insurance company to determine out of pocket expense if they have not yet received this vaccine. Advised may also receive vaccine at local pharmacy or Health Dept. Verbalized acceptance and understanding.  Screening Tests Health Maintenance  Topic Date Due  . COVID-19 Vaccine (3 - Booster for Moderna series) 10/22/2020  . MAMMOGRAM  03/03/2021  . INFLUENZA VACCINE  07/25/2021  . Fecal DNA (Cologuard)  03/18/2022  . DEXA SCAN  11/24/2022  . TETANUS/TDAP  10/28/2024  . Hepatitis C Screening  Completed  . PNA vac Low Risk Adult  Completed  . HPV VACCINES  Aged Out    Health Maintenance  Health Maintenance Due  Topic Date Due  . COVID-19 Vaccine (3 - Booster for Moderna series) 10/22/2020  . MAMMOGRAM  03/03/2021    Colorectal cancer screening: Type of screening: Cologuard. Completed 03/19/2019. Repeat every 3 years  Mammogram status: Ordered 04/06/21. Pt provided with contact info and advised to call to schedule appt.   Bone Density status: Completed 11/24/2020. Results reflect: Bone density results: OSTEOPENIA. Repeat every 2 years.  Lung Cancer Screening: (Low Dose CT Chest recommended if Age 15-80 years, 30 pack-year currently smoking OR have quit w/in 15years.) does qualify.   Lung Cancer Screening Referral: she will think about it  Additional Screening:  Hepatitis C Screening: does qualify; Completed 05/16/2016  Vision  Screening: Recommended annual ophthalmology exams for early detection of glaucoma and other disorders of the eye. Is the patient up to date with their annual eye exam?  Yes  Who is the provider or what is the name of the office in which the patient attends annual eye exams? Dr Rosana Hoes in [place] If pt is not established with a provider, would they like to be referred to a provider to establish care? No .   Dental Screening: Recommended annual dental exams for proper oral hygiene  Community Resource Referral / Chronic Care Management: CRR required this visit?  No   CCM required this visit?  No      Plan:     I have personally reviewed and noted the following in the patient's chart:   . Medical and social history . Use of alcohol, tobacco or illicit drugs  . Current medications and supplements . Functional ability and status . Nutritional status . Physical activity . Advanced directives . List of other physicians . Hospitalizations, surgeries, and ER visits in previous 12 months . Vitals . Screenings to include cognitive, depression, and falls . Referrals and appointments  In addition, I have reviewed and discussed with patient certain preventive protocols, quality metrics, and best practice recommendations. A written personalized care plan for preventive services as well as general preventive health recommendations were provided to patient.     Sandrea Hammond, LPN   [id number]/[id number]   Nurse  Notes: Recommend low dose CT scan screening for lung cancer - patient wants to wait and discuss with PCP

## 2021-04-06 NOTE — Patient Instructions (Signed)
Ms. Bloch , Thank you for taking time to come for your Medicare Wellness Visit. I appreciate your ongoing commitment to your health goals. Please review the following plan we discussed and let me know if I can assist you in the future.   Screening recommendations/referrals: Colonoscopy: Cologuard done 03/19/2019 - Repeat every 3 years Mammogram: Done 03/04/2019 - Order entered today - patient to call and make appointment Bone Density: Done 11/24/2020 - Repeat every 2 years *Low Dose chest CT lung cancer screening - Recommended for smokers of over 30 years* - Discuss with your provider at next visit. Recommended yearly ophthalmology/optometry visit for glaucoma screening and checkup Recommended yearly dental visit for hygiene and checkup  Vaccinations: Influenza vaccine: Done 01/24/2020 - Repeat every year Pneumococcal vaccine: Done 12/03/2015 & 02/20/2018 Tdap vaccine: Done 10/28/2014 - Repeat in 10 years Shingles vaccine: Shingrix discussed. Please contact your pharmacy for coverage information.    Covid-19: First 2 doses done: 03/25/20 & 04/22/20 - due for booster  Advanced directives: Advance directive discussed with you today. I have provided a copy for you to complete at home and have notarized. Once this is complete please bring a copy in to our office so we can scan it into your chart.  Conditions/risks identified: If you wish to quit smoking, help is available. For free tobacco cessation program offerings call the [facility] at [phone] or Live Well Line at 229-626-5878. You may also visit www.[place].com or email [email or web address] .com for more information on other programs.   You may also call 1-800-QUIT-NOW 858-114-3716) or visit www.VirusCrisis.dk or www.BecomeAnEx.org for additional resources on smoking cessation.    Next appointment: Follow up in one year for your annual wellness visit    Preventive Care 65 Years and Older, Female Preventive care  refers to lifestyle choices and visits with your health care provider that can promote health and wellness. What does preventive care include?  A yearly physical exam. This is also called an annual well check.  Dental exams once or twice a year.  Routine eye exams. Ask your health care provider how often you should have your eyes checked.  Personal lifestyle choices, including:  Daily care of your teeth and gums.  Regular physical activity.  Eating a healthy diet.  Avoiding tobacco and drug use.  Limiting alcohol use.  Practicing safe sex.  Taking low-dose aspirin every day.  Taking vitamin and mineral supplements as recommended by your health care provider. What happens during an annual well check? The services and screenings done by your health care provider during your annual well check will depend on your age, overall health, lifestyle risk factors, and family history of disease. Counseling  Your health care provider may ask you questions about your:  Alcohol use.  Tobacco use.  Drug use.  Emotional well-being.  Home and relationship well-being.  Sexual activity.  Eating habits.  History of falls.  Memory and ability to understand (cognition).  Work and work Statistician.  Reproductive health. Screening  You may have the following tests or measurements:  Height, weight, and BMI.  Blood pressure.  Lipid and cholesterol levels. These may be checked every 5 years, or more frequently if you are over 62 years old.  Skin check.  Lung cancer screening. You may have this screening every year starting at age 26 if you have a 30-pack-year history of smoking and currently smoke or have quit within the past 15 years.  Fecal occult blood test (FOBT) of the stool.  You may have this test every year starting at age 21.  Flexible sigmoidoscopy or colonoscopy. You may have a sigmoidoscopy every 5 years or a colonoscopy every 10 years starting at age  34.  Hepatitis C blood test.  Hepatitis B blood test.  Sexually transmitted disease (STD) testing.  Diabetes screening. This is done by checking your blood sugar (glucose) after you have not eaten for a while (fasting). You may have this done every 1-3 years.  Bone density scan. This is done to screen for osteoporosis. You may have this done starting at age 109.  Mammogram. This may be done every 1-2 years. Talk to your health care provider about how often you should have regular mammograms. Talk with your health care provider about your test results, treatment options, and if necessary, the need for more tests. Vaccines  Your health care provider may recommend certain vaccines, such as:  Influenza vaccine. This is recommended every year.  Tetanus, diphtheria, and acellular pertussis (Tdap, Td) vaccine. You may need a Td booster every 10 years.  Zoster vaccine. You may need this after age 10.  Pneumococcal 13-valent conjugate (PCV13) vaccine. One dose is recommended after age 31.  Pneumococcal polysaccharide (PPSV23) vaccine. One dose is recommended after age 46. Talk to your health care provider about which screenings and vaccines you need and how often you need them. This information is not intended to replace advice given to you by your health care provider. Make sure you discuss any questions you have with your health care provider. Document Released: 01/07/2016 Document Revised: 08/30/2016 Document Reviewed: 10/12/2015 Elsevier Interactive Patient Education  2017 [place] Prevention in the Home Falls can cause injuries. They can happen to people of all ages. There are many things you can do to make your home safe and to help prevent falls. What can I do on the outside of my home?  Regularly fix the edges of walkways and driveways and fix any cracks.  Remove anything that might make you trip as you walk through a door, such as a raised step or threshold.  Trim  any bushes or trees on the path to your home.  Use bright outdoor lighting.  Clear any walking paths of anything that might make someone trip, such as rocks or tools.  Regularly check to see if handrails are loose or broken. Make sure that both sides of any steps have handrails.  Any raised decks and porches should have guardrails on the edges.  Have any leaves, snow, or ice cleared regularly.  Use sand or salt on walking paths during winter.  Clean up any spills in your garage right away. This includes oil or grease spills. What can I do in the bathroom?  Use night lights.  Install grab bars by the toilet and in the tub and shower. Do not use towel bars as grab bars.  Use non-skid mats or decals in the tub or shower.  If you need to sit down in the shower, use a plastic, non-slip stool.  Keep the floor dry. Clean up any water that spills on the floor as soon as it happens.  Remove soap buildup in the tub or shower regularly.  Attach bath mats securely with double-sided non-slip rug tape.  Do not have throw rugs and other things on the floor that can make you trip. What can I do in the bedroom?  Use night lights.  Make sure that you have a light by your bed that  is easy to reach.  Do not use any sheets or blankets that are too big for your bed. They should not hang down onto the floor.  Have a firm chair that has side arms. You can use this for support while you get dressed.  Do not have throw rugs and other things on the floor that can make you trip. What can I do in the kitchen?  Clean up any spills right away.  Avoid walking on wet floors.  Keep items that you use a lot in easy-to-reach places.  If you need to reach something above you, use a strong step stool that has a grab bar.  Keep electrical cords out of the way.  Do not use floor polish or wax that makes floors slippery. If you must use wax, use non-skid floor wax.  Do not have throw rugs and other  things on the floor that can make you trip. What can I do with my stairs?  Do not leave any items on the stairs.  Make sure that there are handrails on both sides of the stairs and use them. Fix handrails that are broken or loose. Make sure that handrails are as long as the stairways.  Check any carpeting to make sure that it is firmly attached to the stairs. Fix any carpet that is loose or worn.  Avoid having throw rugs at the top or bottom of the stairs. If you do have throw rugs, attach them to the floor with carpet tape.  Make sure that you have a light switch at the top of the stairs and the bottom of the stairs. If you do not have them, ask someone to add them for you. What else can I do to help prevent falls?  Wear shoes that:  Do not have high heels.  Have rubber bottoms.  Are comfortable and fit you well.  Are closed at the toe. Do not wear sandals.  If you use a stepladder:  Make sure that it is fully opened. Do not climb a closed stepladder.  Make sure that both sides of the stepladder are locked into place.  Ask someone to hold it for you, if possible.  Clearly mark and make sure that you can see:  Any grab bars or handrails.  First and last steps.  Where the edge of each step is.  Use tools that help you move around (mobility aids) if they are needed. These include:  Canes.  Walkers.  Scooters.  Crutches.  Turn on the lights when you go into a dark area. Replace any light bulbs as soon as they burn out.  Set up your furniture so you have a clear path. Avoid moving your furniture around.  If any of your floors are uneven, fix them.  If there are any pets around you, be aware of where they are.  Review your medicines with your doctor. Some medicines can make you feel dizzy. This can increase your chance of falling. Ask your doctor what other things that you can do to help prevent falls. This information is not intended to replace advice given to  you by your health care provider. Make sure you discuss any questions you have with your health care provider. Document Released: 10/07/2009 Document Revised: 05/18/2016 Document Reviewed: 01/15/2015 Elsevier Interactive Patient Education  2017 [employer].

## 2021-04-08 ENCOUNTER — Other Ambulatory Visit: Payer: Self-pay | Admitting: Family

## 2021-04-08 DIAGNOSIS — J449 Chronic obstructive pulmonary disease, unspecified: Secondary | ICD-10-CM

## 2021-07-04 ENCOUNTER — Other Ambulatory Visit: Payer: Self-pay

## 2021-07-04 ENCOUNTER — Ambulatory Visit
Admission: RE | Admit: 2021-07-04 | Discharge: 2021-07-04 | Disposition: A | Discharge status: Home / Self Care | Payer: Medicare Other | Source: Ambulatory Visit | Attending: Family | Admitting: Family

## 2021-07-04 DIAGNOSIS — Z1231 Encounter for screening mammogram for malignant neoplasm of breast: Secondary | ICD-10-CM | POA: Diagnosis not present

## 2021-08-11 ENCOUNTER — Other Ambulatory Visit: Payer: Self-pay

## 2021-08-11 ENCOUNTER — Ambulatory Visit (INDEPENDENT_AMBULATORY_CARE_PROVIDER_SITE_OTHER): Payer: Medicare Other | Admitting: Family Medicine

## 2021-08-11 ENCOUNTER — Encounter: Payer: Self-pay | Admitting: Family Medicine

## 2021-08-11 VITALS — BP 140/60 | HR 71 | Temp 97.1°F | Ht 65.0 in | Wt 162.0 lb

## 2021-08-11 DIAGNOSIS — L03116 Cellulitis of left lower limb: Secondary | ICD-10-CM | POA: Diagnosis not present

## 2021-08-11 DIAGNOSIS — L02416 Cutaneous abscess of left lower limb: Secondary | ICD-10-CM

## 2021-08-11 MED ORDER — DOXYCYCLINE HYCLATE 100 MG PO TABS: 100.0000 mg | ORAL_TABLET | Freq: Two times a day (BID) | ORAL | 0 refills | Status: AC

## 2021-08-11 NOTE — Progress Notes (Signed)
Subjective:  Patient ID: Brianna [name], female    DOB: 07-01-1951, 70 y.o.   MRN: [id number]  Patient Care Team: Sharion Balloon, FNP as PCP - General (Family Medicine) Leticia Clas, OD (Optometry)   Chief Complaint:  sore on leg   HPI: Brianna [name] is a 70 y.o. female presenting on 08/11/2021 for sore on leg   Rash This is a new problem. The current episode started in the past 7 days. The problem has been gradually worsening since onset. The affected locations include the left upper leg. The rash is characterized by redness, swelling and itchiness. It is unknown if there was an exposure to a precipitant. Pertinent negatives include no anorexia, congestion, cough, diarrhea, eye pain, facial edema, fatigue, fever, joint pain, nail changes, rhinorrhea, shortness of breath, sore throat or vomiting. Past treatments include anti-itch cream. The treatment provided no relief.    Relevant past medical, surgical, family, and social history reviewed and updated as indicated.  Allergies and medications reviewed and updated. Data reviewed: Chart in Epic.   Past Medical History:  Diagnosis Date   Cataract     Past Surgical History:  Procedure Laterality Date   BREAST LUMPECTOMY Left 23's   TUBAL LIGATION      Social History   Socioeconomic History   Marital status: Married    Spouse name: Brianna [name]   Number of children: 2   Years of education: Not on file   Highest education level: Bachelor's degree (e.g., BA, AB, BS)  Occupational History   Occupation: Retired    Fish farm manager: [employer]    Comment: Bank [employer]  Tobacco Use   Smoking status: Heavy Smoker    Packs/day: 1.50    Years: 46.00    Pack years: 69.00    Types: Cigarettes   Smokeless tobacco: Never  Vaping Use   Vaping Use: Never used  Substance and Sexual Activity   Alcohol use: Yes    Comment: occasional   Drug use: No   Sexual activity: Not Currently  Other Topics Concern   Not on file  Social History  Narrative   [name] is retired and lives with husband Brianna [name]. They have 2 children that they see regularly as well as grandchildren. She likes to read and spend time with the grandchildren. They have a dog that she walks multiple times per day for exercise.    Social Determinants of Health   Financial Resource Strain: Low Risk    Difficulty of Paying Living Expenses: Not hard at all  Food Insecurity: No Food Insecurity   Worried About Charity fundraiser in the Last Year: Never true   [place] in the Last Year: Never true  Transportation Needs: No Transportation Needs   Lack of Transportation (Medical): No   Lack of Transportation (Non-Medical): No  Physical Activity: Insufficiently Active   Days of Exercise per Week: 7 days   Minutes of Exercise per Session: 20 min  Stress: No Stress Concern Present   Feeling of Stress : Not at all  Social Connections: Moderately Isolated   Frequency of Communication with Friends and Family: More than three times a week   Frequency of Social Gatherings with Friends and Family: Twice a week   Attends Religious Services: Never   Marine scientist or Organizations: No   Attends Archivist Meetings: Never   Marital Status: Married  Human resources officer Violence: Not At Risk   Fear of Current or Ex-Partner:  No   Emotionally Abused: No   Physically Abused: No   Sexually Abused: No    Outpatient Encounter Medications as of 08/11/2021  Medication Sig   albuterol (VENTOLIN HFA) 108 (90 Base) MCG/ACT inhaler 2 PUFFS EVERY 6 HOURS AS NEEDED FOR WHEEZING OR SHORTNESS OF BREATH   atorvastatin (LIPITOR) 20 MG tablet Take 1 tablet (20 mg total) by mouth daily.   Calcium Carbonate-Vitamin D (CALCIUM 600+D) 600-400 MG-UNIT tablet Take 1 tablet by mouth daily.   doxycycline (VIBRA-TABS) 100 MG tablet Take 1 tablet (100 mg total) by mouth 2 (two) times daily for 10 days. 1 po bid   Garlic 10 MG CAPS Take by mouth.   lisinopril (ZESTRIL) 10 MG tablet  Take 1 tablet (10 mg total) by mouth daily.   Tiotropium Bromide Monohydrate (SPIRIVA RESPIMAT) 2.5 MCG/ACT AERS INHALE 2 PUFFS INTO LUNGS DAILY   No facility-administered encounter medications on file as of 08/11/2021.    No Known Allergies  Review of Systems  Constitutional:  Negative for activity change, appetite change, chills, fatigue, fever and unexpected weight change.  HENT: Negative.  Negative for congestion, rhinorrhea and sore throat.   Eyes: Negative.  Negative for pain.  Respiratory:  Negative for cough, chest tightness and shortness of breath.   Cardiovascular:  Negative for chest pain, palpitations and leg swelling.  Gastrointestinal:  Negative for abdominal pain, anorexia, blood in stool, constipation, diarrhea, nausea and vomiting.  Endocrine: Negative.   Genitourinary:  Negative for decreased urine volume, dysuria, frequency and urgency.  Musculoskeletal:  Negative for arthralgias, joint pain and myalgias.  Skin:  Positive for color change and rash. Negative for nail changes, pallor and wound.  Allergic/Immunologic: Negative.   Neurological:  Negative for dizziness, tremors, seizures, syncope, facial asymmetry, speech difficulty, weakness, light-headedness, numbness and headaches.  Hematological: Negative.   Psychiatric/Behavioral:  Negative for confusion, hallucinations, sleep disturbance and suicidal ideas.   All other systems reviewed and are negative.      Objective:  BP 140/60   Pulse 71   Temp (!) 97.1 F (36.2 C) (Temporal)   Ht _0  (1.651 m)   Wt 162 lb (73.5 kg)   SpO2 96%   BMI 26.96 kg/m    Wt Readings from Last 3 Encounters:  08/11/21 162 lb (73.5 kg)  04/06/21 164 lb (74.4 kg)  03/10/21 164 lb 9.6 oz (74.7 kg)    Physical Exam Vitals and nursing note reviewed.  Constitutional:      General: She is not in acute distress.    Appearance: Normal appearance. She is well-developed and well-groomed. She is not ill-appearing, toxic-appearing or  diaphoretic.  HENT:     Head: Normocephalic and atraumatic.     Jaw: There is normal jaw occlusion.     Right Ear: Hearing normal.     Left Ear: Hearing normal.     Nose: Nose normal.     Mouth/Throat:     Lips: Pink.     Mouth: Mucous membranes are moist.     Pharynx: Oropharynx is clear. Uvula midline.  Eyes:     General: Lids are normal.     Extraocular Movements: Extraocular movements intact.     Conjunctiva/sclera: Conjunctivae normal.     Pupils: Pupils are equal, round, and reactive to light.  Neck:     Thyroid: No thyroid mass, thyromegaly or thyroid tenderness.     Vascular: No carotid bruit or JVD.     Trachea: Trachea and phonation normal.  Cardiovascular:  Rate and Rhythm: Normal rate and regular rhythm.     Chest Wall: PMI is not displaced.     Pulses: Normal pulses.     Heart sounds: Normal heart sounds. No murmur heard.   No friction rub. No gallop.  Pulmonary:     Effort: Pulmonary effort is normal. No respiratory distress.     Breath sounds: Normal breath sounds. No wheezing.  Abdominal:     General: Bowel sounds are normal. There is no distension or abdominal bruit.     Palpations: Abdomen is soft. There is no hepatomegaly or splenomegaly.     Tenderness: There is no abdominal tenderness. There is no right CVA tenderness or left CVA tenderness.     Hernia: No hernia is present.  Musculoskeletal:        General: Normal range of motion.     Cervical back: Normal range of motion and neck supple.     Right lower leg: No edema.     Left lower leg: No edema.  Lymphadenopathy:     Cervical: No cervical adenopathy.  Skin:    General: Skin is warm and dry.     Capillary Refill: Capillary refill takes less than 2 seconds.     Coloration: Skin is not cyanotic, jaundiced or pale.     Findings: Erythema and rash present. No abrasion, abscess, acne, bruising, burn, ecchymosis, signs of injury, laceration, lesion, petechiae or wound.       Neurological:      General: No focal deficit present.     Mental Status: She is alert and oriented to person, place, and time.     Cranial Nerves: Cranial nerves are intact.     Sensory: Sensation is intact.     Motor: Motor function is intact.     Coordination: Coordination is intact.     Gait: Gait is intact.     Deep Tendon Reflexes: Reflexes are normal and symmetric.  Psychiatric:        Attention and Perception: Attention and perception normal.        Mood and Affect: Mood and affect normal.        Speech: Speech normal.        Behavior: Behavior normal. Behavior is cooperative.        Thought Content: Thought content normal.        Cognition and Memory: Cognition and memory normal.        Judgment: Judgment normal.    Results for orders placed or performed in visit on 03/10/21  BMP8+EGFR  Result Value Ref Range   Glucose 126 (H) 65 - 99 mg/dL   BUN 11 8 - 27 mg/dL   Creatinine, Ser 0.65 0.57 - 1.00 mg/dL   eGFR 94 >59 mL/min/1.73   BUN/Creatinine Ratio 17 12 - 28   Sodium 139 134 - 144 mmol/L   Potassium 4.5 3.5 - 5.2 mmol/L   Chloride 101 96 - 106 mmol/L   CO2 24 20 - 29 mmol/L   Calcium 9.9 8.7 - 10.3 mg/dL       Pertinent labs & imaging results that were available during my care of the patient were reviewed by me and considered in my medical decision making.  Assessment & Plan:  Cherri was seen today for sore on leg.  Diagnoses and all orders for this visit:  Cellulitis and Abscess of left lower extremity Abscess with cellulitis to left upper leg. Abscess not fluctuant so I&D not performed today. Doxycycline as prescribed.  Warm compresses at least 4 times per day. Pt aware to report any new, worsening, or persistent symptoms.  -     doxycycline (VIBRA-TABS) 100 MG tablet; Take 1 tablet (100 mg total) by mouth 2 (two) times daily for 10 days. 1 po bid     Continue all other maintenance medications.  Follow up plan: Return in about 2 weeks (around 08/25/2021), or if symptoms worsen  or fail to improve.   Continue healthy lifestyle choices, including diet (rich in fruits, vegetables, and lean proteins, and low in salt and simple carbohydrates) and exercise (at least 30 minutes of moderate physical activity daily).  Educational handout given for abscess  The above assessment and management plan was discussed with the patient. The patient verbalized understanding of and has agreed to the management plan. Patient is aware to call the clinic if they develop any new symptoms or if symptoms persist or worsen. Patient is aware when to return to the clinic for a follow-up visit. Patient educated on when it is appropriate to go to the emergency department.   Monia Pouch, FNP-C [facility] [phone]

## 2021-08-15 ENCOUNTER — Telehealth: Payer: Self-pay

## 2021-08-15 DIAGNOSIS — L02416 Cutaneous abscess of left lower limb: Secondary | ICD-10-CM

## 2021-08-15 MED ORDER — CEPHALEXIN 500 MG PO CAPS: 500.0000 mg | ORAL_CAPSULE | Freq: Three times a day (TID) | ORAL | 0 refills | Status: AC

## 2021-08-15 NOTE — Telephone Encounter (Signed)
Patient aware and verbalizes understanding. 

## 2021-08-15 NOTE — Telephone Encounter (Signed)
Discontinue doxycyline. I have sent in keflex instead.

## 2021-08-15 NOTE — Telephone Encounter (Signed)
Patient had visit with Monia Pouch on 08/11/21 and was diagnosed with cellulitis and started on Doxycycline.  Patient took two doses on Thursday and two doses on Friday and woke up Saturday itching with hives.  She has not taken any since then. Her pharmacy is [facility].  Please advise.

## 2021-09-12 ENCOUNTER — Ambulatory Visit (INDEPENDENT_AMBULATORY_CARE_PROVIDER_SITE_OTHER): Payer: Medicare Other | Admitting: Family

## 2021-09-12 ENCOUNTER — Other Ambulatory Visit: Payer: Self-pay

## 2021-09-12 ENCOUNTER — Encounter: Payer: Self-pay | Admitting: Family

## 2021-09-12 VITALS — BP 122/65 | HR 68 | Temp 96.0°F | Ht 65.0 in | Wt 159.0 lb

## 2021-09-12 DIAGNOSIS — F17200 Nicotine dependence, unspecified, uncomplicated: Secondary | ICD-10-CM | POA: Diagnosis not present

## 2021-09-12 DIAGNOSIS — E785 Hyperlipidemia, unspecified: Secondary | ICD-10-CM

## 2021-09-12 DIAGNOSIS — I10 Essential (primary) hypertension: Secondary | ICD-10-CM | POA: Diagnosis not present

## 2021-09-12 DIAGNOSIS — J449 Chronic obstructive pulmonary disease, unspecified: Secondary | ICD-10-CM | POA: Diagnosis not present

## 2021-09-12 DIAGNOSIS — M8580 Other specified disorders of bone density and structure, unspecified site: Secondary | ICD-10-CM | POA: Diagnosis not present

## 2021-09-12 DIAGNOSIS — Z23 Encounter for immunization: Secondary | ICD-10-CM

## 2021-09-12 DIAGNOSIS — E663 Overweight: Secondary | ICD-10-CM

## 2021-09-12 MED ORDER — ALBUTEROL SULFATE HFA 108 (90 BASE) MCG/ACT IN AERS: 1.0000 | INHALATION_SPRAY | Freq: Four times a day (QID) | RESPIRATORY_TRACT | 4 refills | Status: DC | PRN

## 2021-09-12 NOTE — Progress Notes (Signed)
Subjective:    Patient ID: Brianna [name], female    DOB: 1951-08-27, 70 y.o.   MRN: [id number]  Chief Complaint  Patient presents with   Medical Management of Chronic Issues    Pt presents to the office  today for chronic follow up Hypertension This is a chronic problem. The current episode started more than 1 year ago. The problem has been resolved since onset. The problem is controlled. Associated symptoms include malaise/fatigue and shortness of breath ("some times from COPD"). Pertinent negatives include no peripheral edema. The current treatment provides moderate improvement.  Nicotine Dependence Presents for follow-up visit. Her urge triggers include company of smokers. The symptoms have been stable. She smokes 1 pack of cigarettes per day.  Hyperlipidemia This is a chronic problem. The current episode started more than 1 year ago. Exacerbating diseases include obesity. Associated symptoms include shortness of breath ("some times from COPD"). Current antihyperlipidemic treatment includes statins. The current treatment provides moderate improvement of lipids. Risk factors for coronary artery disease include dyslipidemia, hypertension, a sedentary lifestyle and post-menopausal.  COPD Pt has SOB at times, but using albuterol inhaler at least once a day and using Spiriva daily.  Osteopenia  Taking daily calcium and Vit D. Last Dexa scan 11/13/20.    Review of Systems  Constitutional:  Positive for malaise/fatigue.  Respiratory:  Positive for shortness of breath ("some times from COPD").   All other systems reviewed and are negative.     Objective:   Physical Exam Vitals reviewed.  Constitutional:      General: She is not in acute distress.    Appearance: She is well-developed.  HENT:     Head: Normocephalic and atraumatic.     Right Ear: Tympanic membrane normal.     Left Ear: Tympanic membrane normal.  Eyes:     Pupils: Pupils are equal, round, and reactive to light.  Neck:      Thyroid: No thyromegaly.  Cardiovascular:     Rate and Rhythm: Normal rate and regular rhythm.     Heart sounds: Normal heart sounds. No murmur heard. Pulmonary:     Effort: Pulmonary effort is normal. No respiratory distress.     Breath sounds: Normal breath sounds. No wheezing.  Abdominal:     General: Bowel sounds are normal. There is no distension.     Palpations: Abdomen is soft.     Tenderness: There is no abdominal tenderness.  Musculoskeletal:        General: No tenderness. Normal range of motion.     Cervical back: Normal range of motion and neck supple.  Skin:    General: Skin is warm and dry.  Neurological:     Mental Status: She is alert and oriented to person, place, and time.     Cranial Nerves: No cranial nerve deficit.     Deep Tendon Reflexes: Reflexes are normal and symmetric.  Psychiatric:        Behavior: Behavior normal.        Thought Content: Thought content normal.        Judgment: Judgment normal.         BP 122/65   Pulse 68   Temp (!) 96 F (35.6 C) (Temporal)   Ht '5\' 5"'  (1.651 m)   Wt 159 lb (72.1 kg)   SpO2 97%   BMI 26.46 kg/m   Assessment & Plan:  Brianna [name] comes in today with chief complaint of Medical Management of Chronic Issues   Diagnosis  and orders addressed:  1. Chronic obstructive pulmonary disease, unspecified COPD type (HCC) - albuterol (VENTOLIN HFA) 108 (90 Base) MCG/ACT inhaler; Inhale 1-2 puffs into the lungs every 6 (six) hours as needed for wheezing or shortness of breath.  Dispense: 8.5 g; Refill: 4 - CMP14+EGFR - CBC with Differential/Platelet  2. Hypertension, unspecified type - CMP14+EGFR - CBC with Differential/Platelet  3. Osteopenia with high risk of fracture - CMP14+EGFR - CBC with Differential/Platelet  4. Hyperlipidemia, unspecified hyperlipidemia type - CMP14+EGFR - CBC with Differential/Platelet  5. Overweight (BMI 25.0-29.9) - CMP14+EGFR - CBC with Differential/Platelet  6. Tobacco use  disorder - CMP14+EGFR - CBC with Differential/Platelet   Labs pending Health Maintenance reviewed Diet and exercise encouraged  Follow up plan: 6 months    Brianna Dun, FNP

## 2021-09-12 NOTE — Patient Instructions (Signed)
Health Maintenance After Age 71 After age 71, you are at a higher risk for certain long-term diseases and infections as well as injuries from falls. Falls are a major cause of broken bones and head injuries in people who are older than age 71. Getting regular preventive care can help to keep you healthy and well. Preventive care includes getting regular testing and making lifestyle changes as recommended by your health care provider. Talk with your health care provider about: Which screenings and tests you should have. A screening is a test that checks for a disease when you have no symptoms. A diet and exercise plan that is right for you. What should I know about screenings and tests to prevent falls? Screening and testing are the best ways to find a health problem early. Early diagnosis and treatment give you the best chance of managing medical conditions that are common after age 71. Certain conditions and lifestyle choices may make you more likely to have a fall. Your health care provider may recommend: Regular vision checks. Poor vision and conditions such as cataracts can make you more likely to have a fall. If you wear glasses, make sure to get your prescription updated if your vision changes. Medicine review. Work with your health care provider to regularly review all of the medicines you are taking, including over-the-counter medicines. Ask your health care provider about any side effects that may make you more likely to have a fall. Tell your health care provider if any medicines that you take make you feel dizzy or sleepy. Osteoporosis screening. Osteoporosis is a condition that causes the bones to get weaker. This can make the bones weak and cause them to break more easily. Blood pressure screening. Blood pressure changes and medicines to control blood pressure can make you feel dizzy. Strength and balance checks. Your health care provider may recommend certain tests to check your strength and  balance while standing, walking, or changing positions. Foot health exam. Foot pain and numbness, as well as not wearing proper footwear, can make you more likely to have a fall. Depression screening. You may be more likely to have a fall if you have a fear of falling, feel emotionally low, or feel unable to do activities that you used to do. Alcohol use screening. Using too much alcohol can affect your balance and may make you more likely to have a fall. What actions can I take to lower my risk of falls? General instructions Talk with your health care provider about your risks for falling. Tell your health care provider if: You fall. Be sure to tell your health care provider about all falls, even ones that seem minor. You feel dizzy, sleepy, or off-balance. Take over-the-counter and prescription medicines only as told by your health care provider. These include any supplements. Eat a healthy diet and maintain a healthy weight. A healthy diet includes low-fat dairy products, low-fat (lean) meats, and fiber from whole grains, beans, and lots of fruits and vegetables. Home safety Remove any tripping hazards, such as rugs, cords, and clutter. Install safety equipment such as grab bars in bathrooms and safety rails on stairs. Keep rooms and walkways well-lit. Activity  Follow a regular exercise program to stay fit. This will help you maintain your balance. Ask your health care provider what types of exercise are appropriate for you. If you need a cane or walker, use it as recommended by your health care provider. Wear supportive shoes that have nonskid soles. Lifestyle Do not   drink alcohol if your health care provider tells you not to drink. If you drink alcohol, limit how much you have: 0-1 drink a day for women. 0-2 drinks a day for men. Be aware of how much alcohol is in your drink. In the [place] one drink equals one typical bottle of beer (12 oz), one-half glass of wine (5 oz), or one shot of  hard liquor (1 oz). Do not use any products that contain nicotine or tobacco, such as cigarettes and e-cigarettes. If you need help quitting, ask your health care provider. Summary Having a healthy lifestyle and getting preventive care can help to protect your health and wellness after age 71. Screening and testing are the best way to find a health problem early and help you avoid having a fall. Early diagnosis and treatment give you the best chance for managing medical conditions that are more common for people who are older than age 71. Falls are a major cause of broken bones and head injuries in people who are older than age 71. Take precautions to prevent a fall at home. Work with your health care provider to learn what changes you can make to improve your health and wellness and to prevent falls. This information is not intended to replace advice given to you by your health care provider. Make sure you discuss any questions you have with your health care provider. Document Revised: 02/18/2021 Document Reviewed: 11/26/2020 Elsevier Patient Education  2022 [employer]  

## 2021-12-15 ENCOUNTER — Other Ambulatory Visit: Payer: Self-pay | Admitting: Family

## 2021-12-15 DIAGNOSIS — J449 Chronic obstructive pulmonary disease, unspecified: Secondary | ICD-10-CM

## 2022-01-24 ENCOUNTER — Other Ambulatory Visit: Payer: Self-pay | Admitting: Family Medicine

## 2022-01-24 DIAGNOSIS — Z1211 Encounter for screening for malignant neoplasm of colon: Secondary | ICD-10-CM

## 2022-01-27 ENCOUNTER — Other Ambulatory Visit: Payer: Self-pay | Admitting: Family

## 2022-01-27 DIAGNOSIS — J449 Chronic obstructive pulmonary disease, unspecified: Secondary | ICD-10-CM

## 2022-02-16 ENCOUNTER — Other Ambulatory Visit: Payer: Self-pay | Admitting: Family

## 2022-02-16 DIAGNOSIS — E785 Hyperlipidemia, unspecified: Secondary | ICD-10-CM

## 2022-02-16 DIAGNOSIS — I10 Essential (primary) hypertension: Secondary | ICD-10-CM

## 2022-03-07 ENCOUNTER — Encounter: Payer: Self-pay | Admitting: Family

## 2022-03-07 ENCOUNTER — Ambulatory Visit (INDEPENDENT_AMBULATORY_CARE_PROVIDER_SITE_OTHER): Payer: Medicare Other | Admitting: Family

## 2022-03-07 VITALS — BP 157/78 | HR 72 | Temp 98.6°F | Ht 65.0 in | Wt 162.4 lb

## 2022-03-07 DIAGNOSIS — M8580 Other specified disorders of bone density and structure, unspecified site: Secondary | ICD-10-CM | POA: Diagnosis not present

## 2022-03-07 DIAGNOSIS — E785 Hyperlipidemia, unspecified: Secondary | ICD-10-CM

## 2022-03-07 DIAGNOSIS — J449 Chronic obstructive pulmonary disease, unspecified: Secondary | ICD-10-CM | POA: Diagnosis not present

## 2022-03-07 DIAGNOSIS — Z0000 Encounter for general adult medical examination without abnormal findings: Secondary | ICD-10-CM

## 2022-03-07 DIAGNOSIS — F17200 Nicotine dependence, unspecified, uncomplicated: Secondary | ICD-10-CM | POA: Diagnosis not present

## 2022-03-07 DIAGNOSIS — I10 Essential (primary) hypertension: Secondary | ICD-10-CM

## 2022-03-07 DIAGNOSIS — Z0001 Encounter for general adult medical examination with abnormal findings: Secondary | ICD-10-CM

## 2022-03-07 DIAGNOSIS — Z23 Encounter for immunization: Secondary | ICD-10-CM

## 2022-03-07 DIAGNOSIS — E663 Overweight: Secondary | ICD-10-CM

## 2022-03-07 NOTE — Progress Notes (Signed)
? ?Subjective:  ? ? Patient ID: Brianna [name], female    DOB: Jun 13, 1951, 71 y.o.   MRN: [id number] ? ?Chief Complaint  ?Patient presents with  ? Medical Management of Chronic Issues  ? ?Pt presents to the office  today for CPE and chronic follow up. She has COPD and continues to smoke a pack a day. States she has intermittent SOB with exertions. She uses Spiriva daily and albuterol as needed.  ? ?She osteopenia and takes calcium and vit d daily. She walks daily for 30 mins a day. Last Dexa scan 11/23/20. ?Hypertension ?This is a chronic problem. The current episode started more than 1 year ago. The problem has been waxing and waning since onset. The problem is uncontrolled. Associated symptoms include malaise/fatigue and shortness of breath. Pertinent negatives include no peripheral edema. Risk factors for coronary artery disease include dyslipidemia. The current treatment provides mild improvement.  ?Nicotine Dependence ?Presents for follow-up visit. Her urge triggers include company of smokers. The symptoms have been stable. She smokes 1 pack of cigarettes per day.  ?Hyperlipidemia ?This is a chronic problem. The current episode started more than 1 year ago. Associated symptoms include shortness of breath. Current antihyperlipidemic treatment includes statins. The current treatment provides moderate improvement of lipids. Risk factors for coronary artery disease include dyslipidemia, hypertension and a sedentary lifestyle.  ? ? ? ?Review of Systems  ?Constitutional:  Positive for malaise/fatigue.  ?Respiratory:  Positive for shortness of breath.   ?All other systems reviewed and are negative. ? ?Family History  ?Problem Relation Age of Onset  ? Heart disease Mother   ? Osteoporosis Mother   ? Hypertension Mother   ? Cancer Father   ?     lung  ? Diabetes Father   ? Cancer Sister   ?     brain  ? Diabetes Sister   ? Cancer Brother   ?     brain  ? Osteoporosis Sister   ? Cancer Sister   ?     breast  ? Diabetes Brother    ? Diabetes Brother   ? ?Social History  ? ?Socioeconomic History  ? Marital status: Married  ?  Spouse name: [name]  ? Number of children: 2  ? Years of education: Not on file  ? Highest education level: Bachelor's degree (e.g., BA, AB, BS)  ?Occupational History  ? Occupation: Retired  ?  Employer: [employer]  ?  Comment: [employer]  ?Tobacco Use  ? Smoking status: Heavy Smoker  ?  Packs/day: 1.50  ?  Years: 46.00  ?  Pack years: 69.00  ?  Types: Cigarettes  ? Smokeless tobacco: Never  ?Vaping Use  ? Vaping Use: Never used  ?Substance and Sexual Activity  ? Alcohol use: Yes  ?  Comment: occasional  ? Drug use: No  ? Sexual activity: Not Currently  ?Other Topics Concern  ? Not on file  ?Social History Narrative  ? Sitlali is retired and lives with husband [name]. They have 2 children that they see regularly as well as grandchildren. She likes to read and spend time with the grandchildren. They have a dog that she walks multiple times per day for exercise.   ? ?Social Determinants of Health  ? ?Financial Resource Strain: Low Risk   ? Difficulty of Paying Living Expenses: Not hard at all  ?Food Insecurity: No Food Insecurity  ? Worried About Charity fundraiser in the Last Year: Never true  ? Ran  Out of Food in the Last Year: Never true  ?Transportation Needs: No Transportation Needs  ? Lack of Transportation (Medical): No  ? Lack of Transportation (Non-Medical): No  ?Physical Activity: Insufficiently Active  ? Days of Exercise per Week: 7 days  ? Minutes of Exercise per Session: 20 min  ?Stress: No Stress Concern Present  ? Feeling of Stress : Not at all  ?Social Connections: Moderately Isolated  ? Frequency of Communication with Friends and Family: More than three times a week  ? Frequency of Social Gatherings with Friends and Family: Twice a week  ? Attends Religious Services: Never  ? Active Member of Clubs or Organizations: No  ? Attends Archivist Meetings: Never  ? Marital Status: Married  ? ? ?    ?Objective:  ? Physical Exam ?Vitals reviewed.  ?Constitutional:   ?   General: She is not in acute distress. ?   Appearance: She is well-developed.  ?HENT:  ?   Head: Normocephalic and atraumatic.  ?   Right Ear: Tympanic membrane normal.  ?   Left Ear: Tympanic membrane normal.  ?Eyes:  ?   Pupils: Pupils are equal, round, and reactive to light.  ?Neck:  ?   Thyroid: No thyromegaly.  ?Cardiovascular:  ?   Rate and Rhythm: Normal rate and regular rhythm.  ?   Heart sounds: Normal heart sounds. No murmur heard. ?Pulmonary:  ?   Effort: Pulmonary effort is normal. No respiratory distress.  ?   Breath sounds: Normal breath sounds. No wheezing.  ?Abdominal:  ?   General: Bowel sounds are normal. There is no distension.  ?   Palpations: Abdomen is soft.  ?   Tenderness: There is no abdominal tenderness.  ?Musculoskeletal:     ?   General: No tenderness. Normal range of motion.  ?   Cervical back: Normal range of motion and neck supple.  ?Skin: ?   General: Skin is warm and dry.  ?Neurological:  ?   Mental Status: She is alert and oriented to person, place, and time.  ?   Cranial Nerves: No cranial nerve deficit.  ?   Deep Tendon Reflexes: Reflexes are normal and symmetric.  ?Psychiatric:     ?   Behavior: Behavior normal.     ?   Thought Content: Thought content normal.     ?   Judgment: Judgment normal.  ? ? ? ? ?BP (!) 157/78   Pulse 72   Temp 98.6 ?F (37 ?C) (Temporal)   Ht '5\' 5"'  (1.651 m)   Wt 162 lb 6.4 oz (73.7 kg)   BMI 27.02 kg/m?  ? ?   ?Assessment & Plan:  ?Richetta Cubillos comes in today with chief complaint of Medical Management of Chronic Issues ? ? ?Diagnosis and orders addressed: ? ?1. Chronic obstructive pulmonary disease, unspecified COPD type ([place]) ?- CMP14+EGFR ?- CBC with Differential/Platelet ? ?2. Hypertension, unspecified type ?Pt will monitor BP at home ?If continues to be >140/90 will need to adjust medication ?Last visit BP was well controlled ?- CMP14+EGFR ?- CBC with  Differential/Platelet ? ?3. Osteopenia with high risk of fracture ?- CMP14+EGFR ?- CBC with Differential/Platelet ? ?4. Tobacco use disorder ?- CMP14+EGFR ?- CBC with Differential/Platelet ? ?5. Overweight (BMI 25.0-29.9) ?- CMP14+EGFR ?- CBC with Differential/Platelet ? ?6. Hyperlipidemia, unspecified hyperlipidemia type ?- CMP14+EGFR ?- CBC with Differential/Platelet ?- Lipid panel ? ?7. Annual physical exam ?- CMP14+EGFR ?- CBC with Differential/Platelet ?- Lipid panel ?- TSH ? ? ?  Labs pending ?Health Maintenance reviewed ?Diet and exercise encouraged ? ?Follow up plan: ?3 months  ? ? ?Evelina Dun, FNP ? ?

## 2022-03-07 NOTE — Patient Instructions (Signed)
Osteopenia ?Osteopenia is a loss of thickness (density) inside the bones. Another name for osteopenia is low bone mass. Mild osteopenia is a normal part of aging. It is not a disease, and it does not cause symptoms. ?However, if you have osteopenia and continue to lose bone mass, you could develop a condition that causes the bones to become thin and break more easily (osteoporosis). Osteoporosis can cause you to lose some height, have back pain, and have a stooped posture. Although osteopenia is not a disease, making changes to your lifestyle and diet can help to prevent osteopenia from developing into osteoporosis. ?What are the causes? ?Osteopenia is caused by loss of calcium in the bones. Bones are constantly changing. Old bone cells are continually being replaced with new bone cells. This process builds new bone. ?The mineral calcium is needed to build new bone and maintain bone density. Bone density is usually highest around age 55. After that, most people's bodies cannot replace all the bone they have lost with new bone. ?What increases the risk? ?You are more likely to develop this condition if: ?You are older than age 18. ?You are a woman who went through menopause early. ?You have a long illness that keeps you in bed. ?You do not get enough exercise. ?You lack certain nutrients (malnutrition). ?You have an overactive thyroid gland (hyperthyroidism). ?You use products that contain nicotine or tobacco, such as cigarettes, e-cigarettes and chewing tobacco, or you drink a lot of alcohol. ?You are taking medicines that weaken the bones, such as steroids. ?What are the signs or symptoms? ?This condition does not cause any symptoms. You may have a slightly higher risk for bone breaks (fractures), so getting fractures more easily than normal may be an indication of osteopenia. ?How is this diagnosed? ?This condition may be diagnosed based on an X-ray exam that measures bone density (dual-energy X-ray  absorptiometry, or DEXA). This test can measure bone density in your hips, spine, and wrists. ?Osteopenia has no symptoms, so this condition is usually diagnosed after a routine bone density screening test is done for osteoporosis. This routine screening is usually done for: ?Women who are age 29 or older. ?Men who are age 98 or older. ?If you have risk factors for osteopenia, you may have the screening test at an earlier age. ?How is this treated? ?Making dietary and lifestyle changes can lower your risk for osteoporosis. ?If you have severe osteopenia that is close to becoming osteoporosis, this condition can be treated with medicines and dietary supplements such as calcium and vitamin D. These supplements help to rebuild bone density. ?Follow these instructions at home: ?Eating and drinking ?Eat a diet that is high in calcium and vitamin D. ?Calcium is found in dairy products, beans, salmon, and leafy green vegetables like spinach and broccoli. ?Look for foods that have vitamin D and calcium added to them (fortified foods), such as orange juice, cereal, and bread. ? ?Lifestyle ?Do 30 minutes or more of a weight-bearing exercise every day, such as walking, jogging, or playing a sport. These types of exercises strengthen the bones. ?Do not use any products that contain nicotine or tobacco, such as cigarettes, e-cigarettes, and chewing tobacco. If you need help quitting, ask your health care provider. ?Do not drink alcohol if: ?Your health care provider tells you not to drink. ?You are pregnant, may be pregnant, or are planning to become pregnant. ?If you drink alcohol: ?Limit how much you use to: ?0-1 drink a day for women. ?0-2 drinks  a day for men. ?Be aware of how much alcohol is in your drink. In the [place] one drink equals one 12 oz bottle of beer (355 mL), one 5 oz glass of wine (148 mL), or one 1? oz glass of hard liquor (44 mL). ?General instructions ?Take over-the-counter and prescription medicines only as  told by your health care provider. These include vitamins and supplements. ?Take precautions at home to lower your risk of falling, such as: ?Keeping rooms well-lit and free of clutter, such as cords. ?Installing safety rails on stairs. ?Using rubber mats in the bathroom or other areas that are often wet or slippery. ?Keep all follow-up visits. This is important. ?Contact a health care provider if: ?You have not had a bone density screening for osteoporosis and you are: ?A woman who is age 98 or older. ?A man who is age 32 or older. ?You are a postmenopausal woman who has not had a bone density screening for osteoporosis. ?You are older than age 27 and you want to know if you should have bone density screening for osteoporosis. ?Summary ?Osteopenia is a loss of thickness (density) inside the bones. Another name for osteopenia is low bone mass. ?Osteopenia is not a disease, but it may increase your risk for a condition that causes the bones to become thin and break more easily (osteoporosis). ?You may be at risk for osteopenia if you are older than age 23 or if you are a woman who went through early menopause. ?Osteopenia does not cause any symptoms, but it can be diagnosed with a bone density screening test. ?Dietary and lifestyle changes are the first treatment for osteopenia. These may lower your risk for osteoporosis. ?This information is not intended to replace advice given to you by your health care provider. Make sure you discuss any questions you have with your health care provider. ?Document Revised: 05/27/2020 Document Reviewed: 05/27/2020 ?Elsevier Patient Education ? [place]. ? ?

## 2022-03-08 LAB — CMP14+EGFR
ALT: 17 IU/L (ref 0–32)
AST: 14 IU/L (ref 0–40)
Albumin/Globulin Ratio: 2.2 (ref 1.2–2.2)
Albumin: 4.2 g/dL (ref 3.7–4.7)
Alkaline Phosphatase: 79 IU/L (ref 44–121)
BUN/Creatinine Ratio: 18 (ref 12–28)
BUN: 12 mg/dL (ref 8–27)
Bilirubin Total: 0.5 mg/dL (ref 0.0–1.2)
CO2: 25 mmol/L (ref 20–29)
Calcium: 9.7 mg/dL (ref 8.7–10.3)
Chloride: 102 mmol/L (ref 96–106)
Creatinine, Ser: 0.66 mg/dL (ref 0.57–1.00)
Globulin, Total: 1.9 g/dL (ref 1.5–4.5)
Glucose: 105 mg/dL — ABNORMAL HIGH (ref 70–99)
Potassium: 4.6 mmol/L (ref 3.5–5.2)
Sodium: 137 mmol/L (ref 134–144)
Total Protein: 6.1 g/dL (ref 6.0–8.5)
eGFR: 93 mL/min/{1.73_m2} (ref >59)

## 2022-03-08 LAB — LIPID PANEL
Chol/HDL Ratio: 2 ratio (ref 0.0–4.4)
Cholesterol, Total: 141 mg/dL (ref 100–199)
HDL: 71 mg/dL (ref >39)
LDL Chol Calc (NIH): 53 mg/dL (ref 0–99)
Triglycerides: 92 mg/dL (ref 0–149)
VLDL Cholesterol Cal: 17 mg/dL (ref 5–40)

## 2022-03-08 LAB — CBC WITH DIFFERENTIAL/PLATELET
Basophils Absolute: 0.1 10*3/uL (ref 0.0–0.2)
Basos: 1 %
EOS (ABSOLUTE): 0.2 10*3/uL (ref 0.0–0.4)
Eos: 3 %
Hematocrit: 42.2 % (ref 34.0–46.6)
Hemoglobin: 14.3 g/dL (ref 11.1–15.9)
Immature Grans (Abs): 0 10*3/uL (ref 0.0–0.1)
Immature Granulocytes: 0 %
Lymphocytes Absolute: 3.4 10*3/uL — ABNORMAL HIGH (ref 0.7–3.1)
Lymphs: 38 %
MCH: 31.1 pg (ref 26.6–33.0)
MCHC: 33.9 g/dL (ref 31.5–35.7)
MCV: 92 fL (ref 79–97)
Monocytes Absolute: 0.8 10*3/uL (ref 0.1–0.9)
Monocytes: 9 %
Neutrophils Absolute: 4.5 10*3/uL (ref 1.4–7.0)
Neutrophils: 49 %
Platelets: 290 10*3/uL (ref 150–450)
RBC: 4.6 x10E6/uL (ref 3.77–5.28)
RDW: 12.5 % (ref 11.7–15.4)
WBC: 9 10*3/uL (ref 3.4–10.8)

## 2022-03-08 LAB — TSH: TSH: 1.14 u[IU]/mL (ref 0.450–4.500)

## 2022-03-29 DIAGNOSIS — Z1211 Encounter for screening for malignant neoplasm of colon: Secondary | ICD-10-CM | POA: Diagnosis not present

## 2022-03-30 ENCOUNTER — Other Ambulatory Visit: Payer: Self-pay | Admitting: Family

## 2022-03-30 DIAGNOSIS — J449 Chronic obstructive pulmonary disease, unspecified: Secondary | ICD-10-CM

## 2022-04-05 LAB — COLOGUARD: COLOGUARD: NEGATIVE

## 2022-04-06 NOTE — Patient Instructions (Signed)
Brianna [name] , ?Thank you for taking time to come for your Medicare Wellness Visit. I appreciate your ongoing commitment to your health goals. Please review the following plan we discussed and let me know if I can assist you in the future.  ? ?Screening recommendations/referrals: ?Colonoscopy: Cologuard done 03/29/2022 - repeat in 3 years ?Mammogram: Done 07/04/2021 - Repeat annually ?Bone Density: Done 11/24/2020 - Repeat every 2 years ?Recommended yearly ophthalmology/optometry visit for glaucoma screening and checkup ?Recommended yearly dental visit for hygiene and checkup ? ?Vaccinations: ?Influenza vaccine: Done 09/12/2021 - Repeat annually ?Pneumococcal vaccine: Done 12/03/2015 & 02/20/2018 ?Tdap vaccine: Done 10/28/2014 - Repeat in 10 years ?Shingles vaccine: done 03/07/2022 - get second dose in 2-6 months   ?Covid-19: Done 03/25/2020 & 04/22/2020 - for boosters, contact pharmacy ? ?Advanced directives: Advance directive discussed with you today. Even though you declined this today, please call our office should you change your mind, and we can give you the proper paperwork for you to fill out. ? ? ?Conditions/risks identified: Aim for 30 minutes of exercise or brisk walking, 6-8 glasses of water, and 5 servings of fruits and vegetables each day. ?KEEP UP THE GOOD WORK!! ? ?Next appointment: Follow up in one year for your annual wellness visit 2024. ? ? ?Preventive Care 35 Years and Older, Female ?Preventive care refers to lifestyle choices and visits with your health care provider that can promote health and wellness. ?What does preventive care include? ?A yearly physical exam. This is also called an annual well check. ?Dental exams once or twice a year. ?Routine eye exams. Ask your health care provider how often you should have your eyes checked. ?Personal lifestyle choices, including: ?Daily care of your teeth and gums. ?Regular physical activity. ?Eating a healthy diet. ?Avoiding tobacco and drug use. ?Limiting alcohol  use. ?Practicing safe sex. ?Taking low-dose aspirin every day. ?Taking vitamin and mineral supplements as recommended by your health care provider. ?What happens during an annual well check? ?The services and screenings done by your health care provider during your annual well check will depend on your age, overall health, lifestyle risk factors, and family history of disease. ?Counseling  ?Your health care provider may ask you questions about your: ?Alcohol use. ?Tobacco use. ?Drug use. ?Emotional well-being. ?Home and relationship well-being. ?Sexual activity. ?Eating habits. ?History of falls. ?Memory and ability to understand (cognition). ?Work and work Statistician. ?Reproductive health. ?Screening  ?You may have the following tests or measurements: ?Height, weight, and BMI. ?Blood pressure. ?Lipid and cholesterol levels. These may be checked every 5 years, or more frequently if you are over 83 years old. ?Skin check. ?Lung cancer screening. You may have this screening every year starting at age 29 if you have a 30-pack-year history of smoking and currently smoke or have quit within the past 15 years. ?Fecal occult blood test (FOBT) of the stool. You may have this test every year starting at age 54. ?Flexible sigmoidoscopy or colonoscopy. You may have a sigmoidoscopy every 5 years or a colonoscopy every 10 years starting at age 35. ?Hepatitis C blood test. ?Hepatitis B blood test. ?Sexually transmitted disease (STD) testing. ?Diabetes screening. This is done by checking your blood sugar (glucose) after you have not eaten for a while (fasting). You may have this done every 1-3 years. ?Bone density scan. This is done to screen for osteoporosis. You may have this done starting at age 43. ?Mammogram. This may be done every 1-2 years. Talk to your health care provider about  how often you should have regular mammograms. ?Talk with your health care provider about your test results, treatment options, and if necessary,  the need for more tests. ?Vaccines  ?Your health care provider may recommend certain vaccines, such as: ?Influenza vaccine. This is recommended every year. ?Tetanus, diphtheria, and acellular pertussis (Tdap, Td) vaccine. You may need a Td booster every 10 years. ?Zoster vaccine. You may need this after age 84. ?Pneumococcal 13-valent conjugate (PCV13) vaccine. One dose is recommended after age 80. ?Pneumococcal polysaccharide (PPSV23) vaccine. One dose is recommended after age 93. ?Talk to your health care provider about which screenings and vaccines you need and how often you need them. ?This information is not intended to replace advice given to you by your health care provider. Make sure you discuss any questions you have with your health care provider. ?Document Released: 01/07/2016 Document Revised: 08/30/2016 Document Reviewed: 10/12/2015 ?Elsevier Interactive Patient Education ? 2017 [place]. ? ?Fall Prevention in the Home ?Falls can cause injuries. They can happen to people of all ages. There are many things you can do to make your home safe and to help prevent falls. ?What can I do on the outside of my home? ?Regularly fix the edges of walkways and driveways and fix any cracks. ?Remove anything that might make you trip as you walk through a door, such as a raised step or threshold. ?Trim any bushes or trees on the path to your home. ?Use bright outdoor lighting. ?Clear any walking paths of anything that might make someone trip, such as rocks or tools. ?Regularly check to see if handrails are loose or broken. Make sure that both sides of any steps have handrails. ?Any raised decks and porches should have guardrails on the edges. ?Have any leaves, snow, or ice cleared regularly. ?Use sand or salt on walking paths during winter. ?Clean up any spills in your garage right away. This includes oil or grease spills. ?What can I do in the bathroom? ?Use night lights. ?Install grab bars by the toilet and in the  tub and shower. Do not use towel bars as grab bars. ?Use non-skid mats or decals in the tub or shower. ?If you need to sit down in the shower, use a plastic, non-slip stool. ?Keep the floor dry. Clean up any water that spills on the floor as soon as it happens. ?Remove soap buildup in the tub or shower regularly. ?Attach bath mats securely with double-sided non-slip rug tape. ?Do not have throw rugs and other things on the floor that can make you trip. ?What can I do in the bedroom? ?Use night lights. ?Make sure that you have a light by your bed that is easy to reach. ?Do not use any sheets or blankets that are too big for your bed. They should not hang down onto the floor. ?Have a firm chair that has side arms. You can use this for support while you get dressed. ?Do not have throw rugs and other things on the floor that can make you trip. ?What can I do in the kitchen? ?Clean up any spills right away. ?Avoid walking on wet floors. ?Keep items that you use a lot in easy-to-reach places. ?If you need to reach something above you, use a strong step stool that has a grab bar. ?Keep electrical cords out of the way. ?Do not use floor polish or wax that makes floors slippery. If you must use wax, use non-skid floor wax. ?Do not have throw rugs and other  things on the floor that can make you trip. ?What can I do with my stairs? ?Do not leave any items on the stairs. ?Make sure that there are handrails on both sides of the stairs and use them. Fix handrails that are broken or loose. Make sure that handrails are as long as the stairways. ?Check any carpeting to make sure that it is firmly attached to the stairs. Fix any carpet that is loose or worn. ?Avoid having throw rugs at the top or bottom of the stairs. If you do have throw rugs, attach them to the floor with carpet tape. ?Make sure that you have a light switch at the top of the stairs and the bottom of the stairs. If you do not have them, ask someone to add them for  you. ?What else can I do to help prevent falls? ?Wear shoes that: ?Do not have high heels. ?Have rubber bottoms. ?Are comfortable and fit you well. ?Are closed at the toe. Do not wear sandals. ?If you

## 2022-04-07 ENCOUNTER — Ambulatory Visit (INDEPENDENT_AMBULATORY_CARE_PROVIDER_SITE_OTHER): Payer: Medicare Other

## 2022-04-07 VITALS — Ht 65.0 in | Wt 162.0 lb

## 2022-04-07 DIAGNOSIS — Z0000 Encounter for general adult medical examination without abnormal findings: Secondary | ICD-10-CM

## 2022-04-07 NOTE — Progress Notes (Addendum)
? ?Subjective:  ? Brianna [name] is a 71 y.o. female who presents for an Initial Medicare Annual Wellness Visit. ?Virtual Visit via Telephone Note ? ?I connected with  Brianna [name] on 04/07/22 at  2:45 PM EDT by telephone and verified that I am speaking with the correct person using two identifiers. ? ?Location: ?Patient: HOME ?Provider: [facility] ?Persons participating in the virtual visit: patient/Nurse Health Advisor ?  ?I discussed the limitations, risks, security and privacy concerns of performing an evaluation and management service by telephone and the availability of in person appointments. The patient expressed understanding and agreed to proceed. ? ?Interactive audio and video telecommunications were attempted between this nurse and patient, however failed, due to patient having technical difficulties OR patient did not have access to video capability.  We continued and completed visit with audio only. ? ?Some vital signs may be absent or patient reported.  ? ?Chriss Driver, LPN ? ?Review of Systems    ? ?Cardiac Risk Factors include: advanced age (>33mn, >>21women);hypertension;dyslipidemia;sedentary lifestyle;Other (see comment), Risk factor comments: COPD ? ?   ?Objective:  ?  ?Today's Vitals  ? 04/07/22 1456  ?Weight: 162 lb (73.5 kg)  ?Height: '5\' 5"'$  (1.651 m)  ? ?Body mass index is 26.96 kg/m?. ? ? ?  04/07/2022  ?  3:04 PM 04/06/2021  ?  3:51 PM 02/26/2020  ?  3:52 PM 02/25/2019  ?  2:26 PM 05/16/2016  ?  8:53 AM  ?Advanced Directives  ?Does Patient Have a Medical Advance Directive? No No No No No  ?Would patient like information on creating a medical advance directive? No - Patient declined Yes (MAU/Ambulatory/Procedural Areas - Information given) No - Patient declined Yes (MAU/Ambulatory/Procedural Areas - Information given) Yes - Educational materials given  ? ? ?Current Medications (verified) ?Outpatient Encounter Medications as of 04/07/2022  ?Medication Sig  ? albuterol (VENTOLIN HFA) 108 (90 Base) MCG/ACT  inhaler INHALE 1 TO 2 PUFFS EVERY 6 HOURS AS NEEDED FOR WHEEZING OR SHORTNESS OF BREATH  ? atorvastatin (LIPITOR) 20 MG tablet TAKE 1 TABLET DAILY  ? Calcium Carbonate-Vitamin D (CALCIUM 600+D) 600-400 MG-UNIT tablet Take 1 tablet by mouth daily.  ? Garlic 10 MG CAPS Take by mouth.  ? lisinopril (ZESTRIL) 10 MG tablet TAKE 1 TABLET DAILY  ? Probiotic Product (PROBIOTIC-10 PO) Take by mouth.  ? SPIRIVA RESPIMAT 2.5 MCG/ACT AERS INHALE 2 PUFFS INTO LUNGS DAILY  ? ?No facility-administered encounter medications on file as of 04/07/2022.  ? ? ?Allergies (verified) ?Patient has no known allergies.  ? ?History: ?Past Medical History:  ?Diagnosis Date  ? Cataract   ? ?Past Surgical History:  ?Procedure Laterality Date  ? BREAST LUMPECTOMY Left 90's  ? TUBAL LIGATION    ? ?Family History  ?Problem Relation Age of Onset  ? Heart disease Mother   ? Osteoporosis Mother   ? Hypertension Mother   ? Cancer Father   ?     lung  ? Diabetes Father   ? Cancer Sister   ?     brain  ? Diabetes Sister   ? Cancer Brother   ?     brain  ? Osteoporosis Sister   ? Cancer Sister   ?     breast  ? Diabetes Brother   ? Diabetes Brother   ? ?Social History  ? ?Socioeconomic History  ? Marital status: Married  ?  Spouse name: [name]  ? Number of children: 2  ? Years of education: Not  on file  ? Highest education level: Bachelor's degree (e.g., BA, AB, BS)  ?Occupational History  ? Occupation: Retired  ?  Employer: [employer]  ?  Comment: [employer]  ?Tobacco Use  ? Smoking status: Heavy Smoker  ?  Packs/day: 1.50  ?  Years: 46.00  ?  Pack years: 69.00  ?  Types: Cigarettes  ? Smokeless tobacco: Never  ?Vaping Use  ? Vaping Use: Never used  ?Substance and Sexual Activity  ? Alcohol use: Yes  ?  Comment: occasional  ? Drug use: No  ? Sexual activity: Not Currently  ?Other Topics Concern  ? Not on file  ?Social History Narrative  ? Brianna [name] is retired and lives with husband [name]. They have 2 children that they see regularly as well as  grandchildren. She likes to read and spend time with the grandchildren. They have a dog that she walks multiple times per day for exercise.   ? ?Social Determinants of Health  ? ?Financial Resource Strain: Low Risk   ? Difficulty of Paying Living Expenses: Not hard at all  ?Food Insecurity: No Food Insecurity  ? Worried About Charity fundraiser in the Last Year: Never true  ? Ran Out of Food in the Last Year: Never true  ?Transportation Needs: No Transportation Needs  ? Lack of Transportation (Medical): No  ? Lack of Transportation (Non-Medical): No  ?Physical Activity: Sufficiently Active  ? Days of Exercise per Week: 5 days  ? Minutes of Exercise per Session: 30 min  ?Stress: No Stress Concern Present  ? Feeling of Stress : Not at all  ?Social Connections: Socially Integrated  ? Frequency of Communication with Friends and Family: More than three times a week  ? Frequency of Social Gatherings with Friends and Family: More than three times a week  ? Attends Religious Services: 1 to 4 times per year  ? Active Member of Clubs or Organizations: Yes  ? Attends Archivist Meetings: 1 to 4 times per year  ? Marital Status: Married  ? ? ?Tobacco Counseling ?Ready to quit: Not Answered ?Counseling given: Not Answered ? ? ?Clinical Intake: ? ?Pre-visit preparation completed: Yes ? ?Pain : No/denies pain ? ?  ? ?BMI - recorded: 26.96 ?Nutritional Status: BMI 25 -29 Overweight ?Nutritional Risks: None ?Diabetes: No ? ?How often do you need to have someone help you when you read instructions, pamphlets, or other written materials from your doctor or pharmacy?: 1 - Never ? ?Diabetic?NO ? ?Interpreter Needed?: No ? ?Information entered by :: mj Caitrin Pendergraph, lpn ? ? ?Activities of Daily Living ? ?  04/07/2022  ?  3:06 PM  ?In your present state of health, do you have any difficulty performing the following activities:  ?Hearing? 0  ?Vision? 0  ?Difficulty concentrating or making decisions? 0  ?Walking or climbing stairs? 0   ?Dressing or bathing? 0  ?Doing errands, shopping? 0  ?Preparing Food and eating ? N  ?Using the Toilet? N  ?In the past six months, have you accidently leaked urine? Y  ?Comment Stress incontince.  ?Do you have problems with loss of bowel control? N  ?Managing your Medications? N  ?Managing your Finances? N  ?Housekeeping or managing your Housekeeping? N  ? ? ?Patient Care Team: ?Sharion Balloon, FNP as PCP - General (Family Medicine) ?Leticia Clas, OD (Optometry) ? ?Indicate any recent Medical Services you may have received from other than [facility] providers in the past year (date may be  approximate). ? ?   ?Assessment:  ? This is a routine wellness examination for [place]. ? ?Hearing/Vision screen ?Hearing Screening - Comments:: Some hearing issues.  ?Vision Screening - Comments:: Glasses. [facility], Dr. Rosana Hoes. 2022. ? ?Dietary issues and exercise activities discussed: ?Current Exercise Habits: Home exercise routine, Type of exercise: walking, Time (Minutes): 30, Frequency (Times/Week): 5, Weekly Exercise (Minutes/Week): 150, Intensity: Mild, Exercise limited by: cardiac condition(s) ? ? Goals Addressed   ? ?  ?  ?  ?  ? This Visit's Progress  ?  Exercise 3x per week (30 min per time)   On track  ?  She would like to get more active ?Goals Addressed   ? ?  ?  ?  ?  ? This Visit's Progress  ?  Exercise 3x per week (30 min per time)     ?  She would like to get more active ?  ?  Patient Stated   On track  ?  02/26/2020 ?AWV Goal: Keep All Scheduled Appointments ? ?Over the next year, patient will attend all scheduled appointments with their PCP and any specialists that they see.  ?  ?  Quit Smoking     ? ?  ? ?  ?  Quit Smoking   Not on track  ? ?  ? ?Depression Screen ? ?  04/07/2022  ?  3:00 PM 03/07/2022  ?  8:58 AM 09/12/2021  ? 10:53 AM 08/11/2021  ? 10:21 AM 04/06/2021  ?  3:48 PM 03/10/2021  ? 11:52 AM 02/22/2021  ? 10:49 AM  ?PHQ 2/9 Scores  ?PHQ - 2 Score 0 0 0 0 0 0 0  ?PHQ- 9 Score 0 0 0      ?  ?Fall  Risk ? ?  04/07/2022  ?  3:04 PM 08/11/2021  ? 10:21 AM 04/06/2021  ?  3:56 PM 03/10/2021  ? 11:52 AM 02/22/2021  ? 10:49 AM  ?Fall Risk   ?Falls in the past year? 0 0 0 0 0  ?Number falls in past yr: 0  0    ?Injury with F

## 2022-04-17 ENCOUNTER — Other Ambulatory Visit: Payer: Self-pay | Admitting: Family

## 2022-04-17 DIAGNOSIS — J449 Chronic obstructive pulmonary disease, unspecified: Secondary | ICD-10-CM

## 2022-05-19 ENCOUNTER — Other Ambulatory Visit: Payer: Self-pay | Admitting: Family

## 2022-05-19 DIAGNOSIS — E785 Hyperlipidemia, unspecified: Secondary | ICD-10-CM

## 2022-05-19 DIAGNOSIS — I10 Essential (primary) hypertension: Secondary | ICD-10-CM

## 2022-06-16 ENCOUNTER — Ambulatory Visit (INDEPENDENT_AMBULATORY_CARE_PROVIDER_SITE_OTHER): Payer: Medicare Other | Admitting: Nurse Practitioner

## 2022-06-16 ENCOUNTER — Encounter: Payer: Self-pay | Admitting: Nurse Practitioner

## 2022-06-16 VITALS — BP 119/64 | HR 63 | Ht 65.0 in | Wt 157.8 lb

## 2022-06-16 DIAGNOSIS — J069 Acute upper respiratory infection, unspecified: Secondary | ICD-10-CM

## 2022-06-16 MED ORDER — MOMETASONE FUROATE 50 MCG/ACT NA SUSP: 2.0000 | Freq: Every day | NASAL | 12 refills | Status: DC

## 2022-06-16 MED ORDER — GUAIFENESIN ER 600 MG PO TB12: 600.0000 mg | ORAL_TABLET | Freq: Two times a day (BID) | ORAL | 0 refills | Status: DC

## 2022-06-16 MED ORDER — BENZONATATE 100 MG PO CAPS: 100.0000 mg | ORAL_CAPSULE | Freq: Three times a day (TID) | ORAL | 0 refills | Status: DC | PRN

## 2022-06-23 ENCOUNTER — Other Ambulatory Visit: Payer: Self-pay | Admitting: Family

## 2022-06-23 DIAGNOSIS — J449 Chronic obstructive pulmonary disease, unspecified: Secondary | ICD-10-CM

## 2022-08-18 ENCOUNTER — Other Ambulatory Visit: Payer: Self-pay | Admitting: Family

## 2022-08-18 DIAGNOSIS — E785 Hyperlipidemia, unspecified: Secondary | ICD-10-CM

## 2022-08-19 ENCOUNTER — Other Ambulatory Visit: Payer: Self-pay | Admitting: Family

## 2022-08-19 DIAGNOSIS — I10 Essential (primary) hypertension: Secondary | ICD-10-CM

## 2022-09-13 ENCOUNTER — Other Ambulatory Visit: Payer: Self-pay | Admitting: Family

## 2022-09-13 DIAGNOSIS — J449 Chronic obstructive pulmonary disease, unspecified: Secondary | ICD-10-CM

## 2022-09-18 ENCOUNTER — Encounter: Payer: Self-pay | Admitting: Family

## 2022-09-18 ENCOUNTER — Ambulatory Visit (INDEPENDENT_AMBULATORY_CARE_PROVIDER_SITE_OTHER): Payer: Medicare Other | Admitting: Family

## 2022-09-18 VITALS — BP 137/63 | HR 63 | Temp 97.6°F | Ht 65.0 in | Wt 157.0 lb

## 2022-09-18 DIAGNOSIS — E785 Hyperlipidemia, unspecified: Secondary | ICD-10-CM

## 2022-09-18 DIAGNOSIS — I10 Essential (primary) hypertension: Secondary | ICD-10-CM

## 2022-09-18 DIAGNOSIS — J449 Chronic obstructive pulmonary disease, unspecified: Secondary | ICD-10-CM

## 2022-09-18 DIAGNOSIS — F17200 Nicotine dependence, unspecified, uncomplicated: Secondary | ICD-10-CM | POA: Diagnosis not present

## 2022-09-18 DIAGNOSIS — E663 Overweight: Secondary | ICD-10-CM

## 2022-09-18 DIAGNOSIS — L989 Disorder of the skin and subcutaneous tissue, unspecified: Secondary | ICD-10-CM | POA: Diagnosis not present

## 2022-09-18 DIAGNOSIS — M8580 Other specified disorders of bone density and structure, unspecified site: Secondary | ICD-10-CM | POA: Diagnosis not present

## 2022-09-18 MED ORDER — LISINOPRIL 10 MG PO TABS: 10.0000 mg | ORAL_TABLET | Freq: Every day | ORAL | 3 refills | Status: DC

## 2022-09-18 NOTE — Progress Notes (Signed)
Subjective:    Patient ID: Brianna [name], female    DOB: 15-Mar-1951, 71 y.o.   MRN: [id number]  Chief Complaint  Patient presents with   Medical Management of Chronic Issues   Pt presents to the office  today for  chronic follow up. She has COPD and continues to smoke a pack a day. States she has intermittent SOB with exertions. She uses Spiriva daily and albuterol as needed.    She osteopenia and takes calcium and vit d daily. She walks daily for 30 mins a day. Last Dexa scan 11/23/20.  Reports she has a skin lesion on her chin that is nonhealing for the last few years.  Hypertension This is a chronic problem. The current episode started more than 1 year ago. The problem has been resolved since onset. The problem is uncontrolled. Associated symptoms include malaise/fatigue and shortness of breath. Pertinent negatives include no peripheral edema or sweats. Risk factors for coronary artery disease include dyslipidemia, sedentary lifestyle and smoking/tobacco exposure. The current treatment provides mild improvement. There is no history of heart failure.  Hyperlipidemia This is a chronic problem. The current episode started more than 1 year ago. The problem is controlled. Recent lipid tests were reviewed and are normal. Associated symptoms include shortness of breath. Current antihyperlipidemic treatment includes statins. The current treatment provides moderate improvement of lipids. Risk factors for coronary artery disease include dyslipidemia, hypertension, a sedentary lifestyle and post-menopausal.      Review of Systems  Constitutional:  Positive for malaise/fatigue.  Respiratory:  Positive for shortness of breath.   All other systems reviewed and are negative.      Objective:   Physical Exam Vitals reviewed.  Constitutional:      General: She is not in acute distress.    Appearance: She is well-developed.  HENT:     Head: Normocephalic and atraumatic.     Right Ear: External ear  normal.  Eyes:     Pupils: Pupils are equal, round, and reactive to light.  Neck:     Thyroid: No thyromegaly.  Cardiovascular:     Rate and Rhythm: Normal rate and regular rhythm.     Heart sounds: Normal heart sounds. No murmur heard. Pulmonary:     Effort: Pulmonary effort is normal. No respiratory distress.     Breath sounds: Normal breath sounds. No wheezing.  Abdominal:     General: Bowel sounds are normal. There is no distension.     Palpations: Abdomen is soft.     Tenderness: There is no abdominal tenderness.  Musculoskeletal:        General: No tenderness. Normal range of motion.     Cervical back: Normal range of motion and neck supple.  Skin:    General: Skin is warm and dry.     Findings: Erythema present.     Comments: Skin lesion on chin apporx 1.6X1.6 cm  Neurological:     Mental Status: She is alert and oriented to person, place, and time.     Cranial Nerves: No cranial nerve deficit.     Deep Tendon Reflexes: Reflexes are normal and symmetric.  Psychiatric:        Behavior: Behavior normal.        Thought Content: Thought content normal.        Judgment: Judgment normal.       BP (!) 148/60   Pulse 74   Temp 97.6 F (36.4 C) (Temporal)   Ht '5\' 5"'  (1.651 m)  Wt 157 lb (71.2 kg)   SpO2 97%   BMI 26.13 kg/m      Assessment & Plan:  Brianna [name] comes in today with chief complaint of Medical Management of Chronic Issues   Diagnosis and orders addressed:  1. Hypertension, unspecified type - lisinopril (ZESTRIL) 10 MG tablet; Take 1 tablet (10 mg total) by mouth daily.  Dispense: 90 tablet; Refill: 3 - CMP14+EGFR  2. Chronic obstructive pulmonary disease, unspecified COPD type ([place]) - CMP14+EGFR  3. Osteopenia with high risk of fracture - CMP14+EGFR  4. Hyperlipidemia, unspecified hyperlipidemia type - CMP14+EGFR  5. Overweight (BMI 25.0-29.9) - CMP14+EGFR  6. Tobacco use disorder - CMP14+EGFR  7. Skin lesion - Ambulatory referral to  Dermatology - CMP14+EGFR   Labs pending Health Maintenance reviewed Diet and exercise encouraged  Follow up plan: 6 months   Evelina Dun, FNP

## 2022-09-18 NOTE — Patient Instructions (Signed)
Squamous Cell Carcinoma Squamous cell carcinoma is a common form of skin cancer. It begins in the squamous cells in the outer layer of the skin (epidermis). It occurs most often in parts of the body that are frequently exposed to the sun, such as the face, ears, lips, neck, arms, legs, and hands. However, this condition can occur anywhere on the body, including the inside of the mouth, the genital area, and the opening between the buttocks (anus). If squamous cell carcinoma is treated early, it rarely spreads to other areas of the body (metastasizes). If it is not treated, it can affect nearby tissues. In rare cases, it can spread to other areas of the body. What are the causes? This condition is usually caused by exposure to ultraviolet (UV) light. UV light may come from the sun or from tanning beds.  Other causes include exposure to: Arsenic. Radiation. Toxic tars and oils. In very rare cases, a genetic condition that makes a person sensitive to sunlight (xeroderma pigmentosum) may cause the condition. What increases the risk? This condition is more likely to develop in people who: Are older than 72 years of age. Have light-colored skin, blond or red hair, or blue, green, or gray eyes. Have childhood freckling. Have had repeated sunburns or sun exposure over long periods of time, especially during childhood. Use tanning beds. Have had psoralen and ultraviolet A (PUVA) therapy. Have a weakened body defense (immune system). Have had an HPV (human papillomavirus) infection. Have a history of precancerous lesions (actinic keratosis). Have conditions that cause chronic scarring. These can include burn scars, chronic ulcers, heat injuries, and radiation. Use any products that contain nicotine or tobacco. What are the signs or symptoms?  This condition often starts as a red, pink, or brown growth on the skin. The growths have an irregular surface that may feel rough. In some cases, the growths  are easier to feel than to see. The growths may develop into a sore that does not heal. How is this diagnosed? This condition may be diagnosed with: A physical exam. Removal of a tissue sample to be examined under a microscope (biopsy). How is this treated? Treatment for this condition involves removing the cancerous tissue. The method that is used for this depends on the size and location of the tumor, as well as your overall health. Possible treatments include: Electrodesiccation and curettage. This involves alternately scraping and burning the tumor while using an electric current to control bleeding. Cryosurgery. This involves freezing the tumor with liquid nitrogen. Laser therapy. This uses an intense beam of light to remove the tumor. Photodynamic therapy. A chemical cream is applied to the skin, and light exposure is used to activate the chemical. High-energy rays that kill cancer cells (radiation therapy). This may be used for tumors that are deeper in the tissues. Surgical removal (excision) of the tumor. This involves removing the entire tumor and a small amount of normal skin that surrounds it. Mohs surgery. In this procedure, the cancerous skin cells are removed layer by layer until all of the tumor has been removed. Plastic surgery. The tumor is removed, and healthy skin from another part of the body is used to cover the wound (skin graft). Chemotherapy. This treatment uses medicines that kill cancer cells. Chemotherapy creams or lotions. These may be applied directly to the skin where the tumor is located and may be used for smaller tumors. Targeted therapy. This targets specific parts of cancer cells and the area around them to block  the growth and spread of the cancer. Immunotherapy. This treatment helps your body's immune system fight the cancer cells. Follow these instructions at home:  Avoid being out in the sun. Wear protective clothing, including long-sleeved shirts, long  pants, UV-blocking sunglasses, and a hat when outdoors. Do skin self-exams as told by your health care provider. Look for any new spots or changes in your skin. Do not use any products that contain nicotine or tobacco. These products include cigarettes, chewing tobacco, and vaping devices, such as e-cigarettes. If you need help quitting, ask your health care provider. Keep all follow-up visits. This is important. How is this prevented?  Avoid the sun when it is at its strongest. This is usually between 10:00 a.m. and 4:00 p.m. When you are out in the sun, use sunscreen that has a sun protection factor (SPF) of at least 21. Apply sunscreen at least 30 minutes before exposure to the sun. Reapply sunscreen every 2 hours while you are outside. Also reapply it after swimming and after sweating a lot. Always wear hats, protective clothing, and UV-blocking sunglasses when you are outdoors. Do not use tanning beds. Contact a health care provider if: You notice any new growths or any changes in your skin. You have had a squamous cell carcinoma tumor removed and you notice a new growth in the same location. Summary Squamous cell carcinoma is a common form of skin cancer. It begins in the squamous cells in the outer layer of the skin (epidermis). This condition is usually caused by exposure to ultraviolet (UV) light. Treatment for this condition involves removing the cancerous tissue. The method that is used for this depends on the size and location of the tumor, as well as your overall health. Contact a health care provider if you notice any new growths or any changes in your skin. Keep all follow-up visits. This is important. This information is not intended to replace advice given to you by your health care provider. Make sure you discuss any questions you have with your health care provider. Document Revised: 06/07/2021 Document Reviewed: 06/07/2021 Elsevier Patient Education  Brianna [name].

## 2022-09-19 LAB — CMP14+EGFR
ALT: 17 IU/L (ref 0–32)
AST: 17 IU/L (ref 0–40)
Albumin/Globulin Ratio: 1.9 (ref 1.2–2.2)
Albumin: 4.3 g/dL (ref 3.8–4.8)
Alkaline Phosphatase: 90 IU/L (ref 44–121)
BUN/Creatinine Ratio: 13 (ref 12–28)
BUN: 8 mg/dL (ref 8–27)
Bilirubin Total: 0.6 mg/dL (ref 0.0–1.2)
CO2: 26 mmol/L (ref 20–29)
Calcium: 10.1 mg/dL (ref 8.7–10.3)
Chloride: 100 mmol/L (ref 96–106)
Creatinine, Ser: 0.61 mg/dL (ref 0.57–1.00)
Globulin, Total: 2.3 g/dL (ref 1.5–4.5)
Glucose: 67 mg/dL — ABNORMAL LOW (ref 70–99)
Potassium: 4.6 mmol/L (ref 3.5–5.2)
Sodium: 138 mmol/L (ref 134–144)
Total Protein: 6.6 g/dL (ref 6.0–8.5)
eGFR: 95 mL/min/{1.73_m2} (ref >59)

## 2022-09-29 ENCOUNTER — Telehealth: Payer: Self-pay | Admitting: Family

## 2022-10-03 ENCOUNTER — Telehealth: Payer: Self-pay | Admitting: Family

## 2022-10-03 NOTE — Telephone Encounter (Signed)
Patient aware per notes through mychart I called patient she does not use mychart she is aware and will call that office to set up an appointment.   Dear Brianna [name]:  After careful review of the medical information, your referral has been approved. See below for additional details. If you have questions regarding why this referral was placed, please contact the referring provider.     You must be actively enrolled with your insurance plan for coverage of the authorized service.  If you have any questions regarding your coverage, please contact your insurance carrier directly.  If you do not already have an appointment, please call [phone] to make an appointment.      Information for Referral #: [id number]  Diagnoses:   L98.9 (ICD-10-CM) - Skin lesion  Procedures: REF19 - AMB REFERRAL TO DERMATOLOGY Authorization #:     Referring Provider [place] [phone] Referring To Provider Information Amber Register [place] [address] [phone]   Referral Start Date: 09/18/2022 Referral End Date: 09/18/2023

## 2022-10-03 NOTE — Telephone Encounter (Signed)
Ok

## 2022-10-03 NOTE — Telephone Encounter (Signed)
Patient calling to check on referral for dermatologist. She has not been called and there are no notes. Please call back and advise.

## 2022-10-10 ENCOUNTER — Other Ambulatory Visit: Payer: Self-pay | Admitting: Family

## 2022-10-10 DIAGNOSIS — J449 Chronic obstructive pulmonary disease, unspecified: Secondary | ICD-10-CM

## 2022-11-09 DIAGNOSIS — C44319 Basal cell carcinoma of skin of other parts of face: Secondary | ICD-10-CM | POA: Diagnosis not present

## 2022-11-15 ENCOUNTER — Other Ambulatory Visit: Payer: Self-pay | Admitting: Family

## 2022-11-15 DIAGNOSIS — E785 Hyperlipidemia, unspecified: Secondary | ICD-10-CM

## 2022-12-06 DIAGNOSIS — C44319 Basal cell carcinoma of skin of other parts of face: Secondary | ICD-10-CM | POA: Diagnosis not present

## 2023-01-01 ENCOUNTER — Other Ambulatory Visit: Payer: Self-pay | Admitting: Family

## 2023-01-01 DIAGNOSIS — J449 Chronic obstructive pulmonary disease, unspecified: Secondary | ICD-10-CM

## 2023-01-10 ENCOUNTER — Encounter: Payer: Self-pay | Admitting: Plastic Surgery

## 2023-01-10 ENCOUNTER — Ambulatory Visit: Payer: Medicare Other | Admitting: Plastic Surgery

## 2023-01-10 VITALS — BP 162/67 | HR 74 | Ht 65.0 in | Wt 147.0 lb

## 2023-01-10 DIAGNOSIS — F17210 Nicotine dependence, cigarettes, uncomplicated: Secondary | ICD-10-CM

## 2023-01-10 DIAGNOSIS — L989 Disorder of the skin and subcutaneous tissue, unspecified: Secondary | ICD-10-CM

## 2023-01-10 NOTE — Progress Notes (Signed)
Ms. Tarver   Referring Provider Sharion Balloon, [address] [place],  [place] [address]   CC:  Chief Complaint  Patient presents with   Advice Only      Eliyanah Elgersma is an 72 y.o. female.  HPI: Ms. Lober is a very pleasant 72 year old female who is referred by her dermatologist for evaluation prior to Mohs surgery.  The patient has a lesion on the left aspect of the chin she will undergo resection of on February 5.  She will be sent to my office for closure of the wound.  The obvious lesion measures approximately 3 x 3 cm however the defect is expected to be larger once the Mohs surgery is complete.  Patient is a smoker who requires albuterol daily.  She is also on antihypertensives and her blood pressure is slightly elevated today.  No Known Allergies  Outpatient Encounter Medications as of 01/10/2023  Medication Sig   albuterol (VENTOLIN HFA) 108 (90 Base) MCG/ACT inhaler INHALE 1 TO 2 PUFFS EVERY 6 HOURS AS NEEDED FOR WHEEZING OR SHORTNESS OF BREATH   atorvastatin (LIPITOR) 20 MG tablet TAKE ONE TABLET ONCE DAILY (needs appt FOR further refills)   Calcium Carbonate-Vitamin D (CALCIUM 600+D) 600-400 MG-UNIT tablet Take 1 tablet by mouth daily.   lisinopril (ZESTRIL) 10 MG tablet Take 1 tablet (10 mg total) by mouth daily.   mometasone (NASONEX) 50 MCG/ACT nasal spray Place 2 sprays into the nose daily.   Probiotic Product (PROBIOTIC-10 PO) Take by mouth.   SPIRIVA RESPIMAT 2.5 MCG/ACT AERS INHALE 2 PUFFS INTO LUNGS DAILY   [DISCONTINUED] Garlic 10 MG CAPS Take by mouth. (Patient not taking: Reported on 01/10/2023)   [DISCONTINUED] guaiFENesin (MUCINEX) 600 MG 12 hr tablet Take 1 tablet (600 mg total) by mouth 2 (two) times daily. (Patient not taking: Reported on 01/10/2023)   No facility-administered encounter medications on file as of 01/10/2023.     Past Medical History:  Diagnosis Date   Cataract     Past Surgical History:  Procedure Laterality Date   BREAST LUMPECTOMY  Left 35's   TUBAL LIGATION      Family History  Problem Relation Age of Onset   Heart disease Mother    Osteoporosis Mother    Hypertension Mother    Cancer Father        lung   Diabetes Father    Cancer Sister        brain   Diabetes Sister    Cancer Brother        brain   Osteoporosis Sister    Cancer Sister        breast   Diabetes Brother    Diabetes Brother     Social History   Social History Narrative   Selin is retired and lives with husband [name]. They have 2 children that they see regularly as well as grandchildren. She likes to read and spend time with the grandchildren. They have a dog that she walks multiple times per day for exercise.      Review of Systems General: Denies fevers, chills, weight loss CV: Denies chest pain, shortness of breath, palpitations Skin: Excoriated lesion on the left side of her chin.  Physical Exam    01/10/2023   11:16 AM 09/18/2022   11:54 AM 09/18/2022   11:36 AM  Vitals with BMI  Height '5\' 5"'$     Weight 147 lbs    BMI 60.45    Systolic [phone]  Diastolic 67 63 60  Pulse 74 63     General:  No acute distress,  Alert and oriented, Non-Toxic, Normal speech and affect Skin: Patient has a 3 x 3 lesion on the left side of her chin.  There is some skin laxity on both sides of the lesion as well as in the submental region. Mammogram: Not applicable Assessment/Plan Skin cancer, Mohs surgery pending: Patient is scheduled to have a Mohs resection of the lesion on her chin.  We discussed the most likely closure which will be a VY advancement flap on either side.  If the excision involves the lip this plan will need to be revised to include lip closure.  The patient understands that her smoking will make the healing of her wound much more difficult.  If I feel that there is any compromise due to tension I will also add a tissue substitute such as myriad or ACell.  Of bleeding infection scarring and damage to surrounding structures  including the facial nerve branches were discussed.  The patient understands and would like to proceed.  Camillia Herter 01/10/2023, 2:02 PM

## 2023-01-11 ENCOUNTER — Telehealth: Payer: Self-pay | Admitting: *Deleted

## 2023-01-11 NOTE — Telephone Encounter (Signed)
PA submitted and pending for [place], [place], [place], [place], [place]  [id number] w/ [place]

## 2023-01-12 ENCOUNTER — Ambulatory Visit (INDEPENDENT_AMBULATORY_CARE_PROVIDER_SITE_OTHER): Payer: Medicare Other | Admitting: Student

## 2023-01-12 ENCOUNTER — Telehealth: Payer: Self-pay

## 2023-01-12 VITALS — BP 160/75 | HR 91 | Wt 146.2 lb

## 2023-01-12 DIAGNOSIS — L989 Disorder of the skin and subcutaneous tissue, unspecified: Secondary | ICD-10-CM

## 2023-01-12 MED ORDER — TRAMADOL HCL 50 MG PO TABS: 50.0000 mg | ORAL_TABLET | Freq: Three times a day (TID) | ORAL | 0 refills | Status: DC | PRN

## 2023-01-12 MED ORDER — ONDANSETRON HCL 4 MG PO TABS: 4.0000 mg | ORAL_TABLET | Freq: Three times a day (TID) | ORAL | 0 refills | Status: DC | PRN

## 2023-01-12 NOTE — Progress Notes (Signed)
   Patient ID: Brianna [name], female    DOB: 11/29/1951, 72 y.o.   MRN: [id number]  Chief Complaint  Patient presents with   Pre-op Exam      ICD-10-CM   1. Skin lesion of face  L98.9        History of Present Illness: Brianna [name] is a 72 y.o.  female  with a history of skin lesion to the face.  She presents for preoperative evaluation for upcoming procedure, closure of Mohs defect to the left chin, possible complex closure, possible local tissue rearrangement, possible rotational flap, possible tissue matrix placement (myriad or ACell), scheduled for 01/30/2023 with Dr. Taylor.  Patient is accompanied by her husband at bedside.  Patient denies any cardiac history.  She denies being on any blood thinners.  Patient reports she is a current daily smoker, smokes a little less than 1 pack/day.  Patient denies taking any birth control or hormone replacement.  She denies any history of miscarriages.  She denies any personal or family history of blood clots or clotting diseases.  She denies any recent surgeries, traumas, infections or hospitalizations.  Denies any history of stroke or heart attack.  She denies any history of inflammatory bowel disease.  She denies any history of asthma.  She does report history of COPD.  She denies any history of cancer.  She denies any recent fevers, chills or changes in her health.  Patient reports that she has had a little bit of postoperative nausea and vomiting in the past.  She denies any other issues with anesthesia.  Summary of Previous Visit: Patient was seen by Dr. Taylor on 01/10/2023.  At this visit, patient reported that she was referred here by her dermatologist for evaluation prior to her Mohs surgery.  Patient stated she had a lesion on the left aspect of the chin which she will go for resection of on February 5.  Patient will then be sent to our clinic for closure of the wound.  The obvious lesion measured approximately 3 cm x 3 cm, however the defect is as  expected to be larger once the Mohs surgery is complete.  Patient is also a smoker who requires albuterol daily.  It was discussed with the patient that the closure will most likely be an advancement flap on either side.  It was discussed with the patient that if the excision involves the lip, this plan will need to be revised to include lip closure.  The patient also expressed understanding that smoking will make her wound healing more difficult.  It was also discussed with the patient that if there is any compromise due to tension, a tissue substitute such as myriad or ACell will also be added.  Job: Retired  PMH Significant for: Hypertension, COPD, tobacco use  Patient's systolic blood pressure today is in the 160s.  She denies any headaches, changes in her vision or chest pain.  She denies any dizziness.  She reports she is a little bit stressed for today's appointment.  I recommended she follow-up with her PCP if it continues to remain this high or go to the emergency room if she develops any headaches, changes in her vision or chest pain.  Patient expressed understanding.   Past Medical History: Allergies: Allergies  Allergen Reactions   Doxycycline Hives    Current Medications:  Current Outpatient Medications:    albuterol (VENTOLIN HFA) 108 (90 Base) MCG/ACT inhaler, INHALE 1 TO 2 PUFFS EVERY 6 HOURS AS NEEDED   FOR WHEEZING OR SHORTNESS OF BREATH, Disp: 8.5 g, Rfl: 1   atorvastatin (LIPITOR) 20 MG tablet, TAKE ONE TABLET ONCE DAILY (needs appt FOR further refills), Disp: 90 tablet, Rfl: 0   Calcium Carbonate-Vitamin D (CALCIUM 600+D) 600-400 MG-UNIT tablet, Take 1 tablet by mouth daily., Disp: , Rfl:    lisinopril (ZESTRIL) 10 MG tablet, Take 1 tablet (10 mg total) by mouth daily., Disp: 90 tablet, Rfl: 3   mometasone (NASONEX) 50 MCG/ACT nasal spray, Place 2 sprays into the nose daily., Disp: 1 each, Rfl: 12   Probiotic Product (PROBIOTIC-10 PO), Take by mouth., Disp: , Rfl:     SPIRIVA RESPIMAT 2.5 MCG/ACT AERS, INHALE 2 PUFFS INTO LUNGS DAILY, Disp: 4 g, Rfl: 2  Past Medical Problems: Past Medical History:  Diagnosis Date   Cataract     Past Surgical History: Past Surgical History:  Procedure Laterality Date   BREAST LUMPECTOMY Left 90's   TUBAL LIGATION      Social History: Social History   Socioeconomic History   Marital status: Married    Spouse name: [name]   Number of children: 2   Years of education: Not on file   Highest education level: Bachelor's degree (e.g., BA, AB, BS)  Occupational History   Occupation: Retired    Employer: [employer]    Comment: [employer]  Tobacco Use   Smoking status: Heavy Smoker    Packs/day: 1.50    Years: 46.00    Total pack years: 69.00    Types: Cigarettes   Smokeless tobacco: Never  Vaping Use   Vaping Use: Never used  Substance and Sexual Activity   Alcohol use: Yes    Comment: occasional   Drug use: No   Sexual activity: Not Currently  Other Topics Concern   Not on file  Social History Narrative   Ilma is retired and lives with husband [name]. They have 2 children that they see regularly as well as grandchildren. She likes to read and spend time with the grandchildren. They have a dog that she walks multiple times per day for exercise.    Social Determinants of Health   Financial Resource Strain: Low Risk  (04/07/2022)   Overall Financial Resource Strain (CARDIA)    Difficulty of Paying Living Expenses: Not hard at all  Food Insecurity: No Food Insecurity (04/07/2022)   Hunger Vital Sign    Worried About Running Out of Food in the Last Year: Never true    Ran Out of Food in the Last Year: Never true  Transportation Needs: No Transportation Needs (04/07/2022)   PRAPARE - Transportation    Lack of Transportation (Medical): No    Lack of Transportation (Non-Medical): No  Physical Activity: Sufficiently Active (04/07/2022)   Exercise Vital Sign    Days of Exercise per Week: 5 days     Minutes of Exercise per Session: 30 min  Stress: No Stress Concern Present (04/07/2022)   [employer] - Occupational Stress Questionnaire    Feeling of Stress : Not at all  Social Connections: Socially Integrated (04/07/2022)   Social Connection and Isolation Panel [NHANES]    Frequency of Communication with Friends and Family: More than three times a week    Frequency of Social Gatherings with Friends and Family: More than three times a week    Attends Religious Services: 1 to 4 times per year    Active Member of Clubs or Organizations: Yes    Attends Club or Organization   Meetings: 1 to 4 times per year    Marital Status: Married  Intimate Partner Violence: Not At Risk (04/07/2022)   Humiliation, Afraid, Rape, and Kick questionnaire    Fear of Current or Ex-Partner: No    Emotionally Abused: No    Physically Abused: No    Sexually Abused: No    Family History: Family History  Problem Relation Age of Onset   Heart disease Mother    Osteoporosis Mother    Hypertension Mother    Cancer Father        lung   Diabetes Father    Cancer Sister        brain   Diabetes Sister    Cancer Brother        brain   Osteoporosis Sister    Cancer Sister        breast   Diabetes Brother    Diabetes Brother     Review of Systems: Denies fevers or chills  Physical Exam: Vital Signs BP (!) 160/75 (BP Location: Left Arm, Patient Position: Sitting, Cuff Size: Small)   Pulse 91   Wt 146 lb 3.2 oz (66.3 kg)   SpO2 96%   BMI 24.33 kg/m   Physical Exam  Constitutional:      General: Not in acute distress.    Appearance: Normal appearance. Not ill-appearing.  HENT:     Head: Normocephalic and atraumatic.  Neck:     Musculoskeletal: Normal range of motion.  Cardiovascular:     Rate and Rhythm: Normal rate Pulmonary:     Effort: Pulmonary effort is normal. No respiratory distress.  Musculoskeletal: Normal range of motion.  Lower extremities: Nonswollen, no  varicose veins noted Skin:    General: Skin is warm and dry.  Neurological:     Mental Status: Alert and oriented to person, place, and time. Mental status is at baseline.  Psychiatric:        Mood and Affect: Mood normal.        Behavior: Behavior normal.    Assessment/Plan: The patient is scheduled for closure of Mohs defect left chin, possible complex closure, possible tissue rearrangement, possible rotational flap, possible tissue matrix placement with Dr. Taylor.  Risks, benefits, and alternatives of procedure discussed, questions answered and consent obtained.    Smoking Status: current smoker; Counseling Given?  I discussed with the patient and patient's husband that smoking and secondhand smoking will increase the risk of postoperative complications, especially in regards to wound healing.  I recommended the patient's stop smoking as soon as possible and continue smoking cessation throughout the perioperative period.  Patient expressed understanding.  Caprini Score: 6; Risk Factors include: Age, history of COPD, history of smoking and length of planned surgery. Recommendation for mechanical prophylaxis. Encourage early ambulation.   Will send clearance to patient's PCP.  Pictures obtained: @consult  Post-op Rx sent to pharmacy:  Tramadol, Zofran  I instructed the patient to hold her lisinopril the day of surgery and hold any vitamins or supplements at least 1 week prior to surgery.  Patient expressed understanding.  Patient was provided with the General Surgical Risk consent document and Pain Medication Agreement prior to their appointment.  They had adequate time to read through the risk consent documents and Pain Medication Agreement. We also discussed them in person together during this preop appointment. All of their questions were answered to their satisfaction.  Recommended calling if they have any further questions.  Risk consent form and Pain Medication Agreement to   be  scanned into patient's chart.  The consent was obtained with risks and complications reviewed which included bleeding, pain, scar, infection and the risk of anesthesia.  The patients questions were answered to the patients expressed satisfaction.   I discussed with the patient that she may be at increased risk of perioperative complications given her history of smoking and COPD.  Patient expressed understanding.   Electronically signed by: Monroe Paone E Lee Kuang, PA-C 01/12/2023 12:56 PM  

## 2023-01-12 NOTE — Telephone Encounter (Signed)
Faxed SX clearance to PCP Evelina Dun, PA-C

## 2023-01-12 NOTE — H&P (View-Only) (Signed)
Patient ID: Brianna [name], female    DOB: Jan 15, 1951, 72 y.o.   MRN: [id number]  Chief Complaint  Patient presents with   Pre-op Exam      ICD-10-CM   1. Skin lesion of face  L98.9        History of Present Illness: Brianna [name] is a 72 y.o.  female  with a history of skin lesion to the face.  She presents for preoperative evaluation for upcoming procedure, closure of Mohs defect to the left chin, possible complex closure, possible local tissue rearrangement, possible rotational flap, possible tissue matrix placement (myriad or ACell), scheduled for 01/30/2023 with Dr. Lovena Le.  Patient is accompanied by her husband at bedside.  Patient denies any cardiac history.  She denies being on any blood thinners.  Patient reports she is a current daily smoker, smokes a little less than 1 pack/day.  Patient denies taking any birth control or hormone replacement.  She denies any history of miscarriages.  She denies any personal or family history of blood clots or clotting diseases.  She denies any recent surgeries, traumas, infections or hospitalizations.  Denies any history of stroke or heart attack.  She denies any history of inflammatory bowel disease.  She denies any history of asthma.  She does report history of COPD.  She denies any history of cancer.  She denies any recent fevers, chills or changes in her health.  Patient reports that she has had a little bit of postoperative nausea and vomiting in the past.  She denies any other issues with anesthesia.  Summary of Previous Visit: Patient was seen by Dr. Lovena Le on 01/10/2023.  At this visit, patient reported that she was referred here by her dermatologist for evaluation prior to her Mohs surgery.  Patient stated she had a lesion on the left aspect of the chin which she will go for resection of on February 5.  Patient will then be sent to our clinic for closure of the wound.  The obvious lesion measured approximately 3 cm x 3 cm, however the defect is as  expected to be larger once the Mohs surgery is complete.  Patient is also a smoker who requires albuterol daily.  It was discussed with the patient that the closure will most likely be an advancement flap on either side.  It was discussed with the patient that if the excision involves the lip, this plan will need to be revised to include lip closure.  The patient also expressed understanding that smoking will make her wound healing more difficult.  It was also discussed with the patient that if there is any compromise due to tension, a tissue substitute such as myriad or ACell will also be added.  Job: Retired  PMH Significant for: Hypertension, COPD, tobacco use  Patient's systolic blood pressure today is in the 160s.  She denies any headaches, changes in her vision or chest pain.  She denies any dizziness.  She reports she is a little bit stressed for today's appointment.  I recommended she follow-up with her PCP if it continues to remain this high or go to the emergency room if she develops any headaches, changes in her vision or chest pain.  Patient expressed understanding.   Past Medical History: Allergies: Allergies  Allergen Reactions   Doxycycline Hives    Current Medications:  Current Outpatient Medications:    albuterol (VENTOLIN HFA) 108 (90 Base) MCG/ACT inhaler, INHALE 1 TO 2 PUFFS EVERY 6 HOURS AS NEEDED  FOR WHEEZING OR SHORTNESS OF BREATH, Disp: 8.5 g, Rfl: 1   atorvastatin (LIPITOR) 20 MG tablet, TAKE ONE TABLET ONCE DAILY (needs appt FOR further refills), Disp: 90 tablet, Rfl: 0   Calcium Carbonate-Vitamin D (CALCIUM 600+D) 600-400 MG-UNIT tablet, Take 1 tablet by mouth daily., Disp: , Rfl:    lisinopril (ZESTRIL) 10 MG tablet, Take 1 tablet (10 mg total) by mouth daily., Disp: 90 tablet, Rfl: 3   mometasone (NASONEX) 50 MCG/ACT nasal spray, Place 2 sprays into the nose daily., Disp: 1 each, Rfl: 12   Probiotic Product (PROBIOTIC-10 PO), Take by mouth., Disp: , Rfl:     SPIRIVA RESPIMAT 2.5 MCG/ACT AERS, INHALE 2 PUFFS INTO LUNGS DAILY, Disp: 4 g, Rfl: 2  Past Medical Problems: Past Medical History:  Diagnosis Date   Cataract     Past Surgical History: Past Surgical History:  Procedure Laterality Date   BREAST LUMPECTOMY Left 90's   TUBAL LIGATION      Social History: Social History   Socioeconomic History   Marital status: Married    Spouse name: [name]   Number of children: 2   Years of education: Not on file   Highest education level: Bachelor's degree (e.g., BA, AB, BS)  Occupational History   Occupation: Retired    Fish farm manager: [employer]    Comment: [employer]  Tobacco Use   Smoking status: Heavy Smoker    Packs/day: 1.50    Years: 46.00    Total pack years: 69.00    Types: Cigarettes   Smokeless tobacco: Never  Vaping Use   Vaping Use: Never used  Substance and Sexual Activity   Alcohol use: Yes    Comment: occasional   Drug use: No   Sexual activity: Not Currently  Other Topics Concern   Not on file  Social History Narrative   Brianna [name] is retired and lives with husband [name]. They have 2 children that they see regularly as well as grandchildren. She likes to read and spend time with the grandchildren. They have a dog that she walks multiple times per day for exercise.    Social Determinants of Health   Financial Resource Strain: Low Risk  (04/07/2022)   Overall Financial Resource Strain (CARDIA)    Difficulty of Paying Living Expenses: Not hard at all  Food Insecurity: No Food Insecurity (04/07/2022)   Hunger Vital Sign    Worried About Running Out of Food in the Last Year: Never true    Ran Out of Food in the Last Year: Never true  Transportation Needs: No Transportation Needs (04/07/2022)   PRAPARE - Hydrologist (Medical): No    Lack of Transportation (Non-Medical): No  Physical Activity: Sufficiently Active (04/07/2022)   Exercise Vital Sign    Days of Exercise per Week: 5 days     Minutes of Exercise per Session: 30 min  Stress: No Stress Concern Present (04/07/2022)   [place]    Feeling of Stress : Not at all  Social Connections: [place] (04/07/2022)   Social Connection and Isolation Panel [NHANES]    Frequency of Communication with Friends and Family: More than three times a week    Frequency of Social Gatherings with Friends and Family: More than three times a week    Attends Religious Services: 1 to 4 times per year    Active Member of [employer] or Organizations: Yes    Attends Archivist  Meetings: 1 to 4 times per year    Marital Status: Married  Human resources officer Violence: Not At Risk (04/07/2022)   Humiliation, Afraid, Rape, and Kick questionnaire    Fear of Current or Ex-Partner: No    Emotionally Abused: No    Physically Abused: No    Sexually Abused: No    Family History: Family History  Problem Relation Age of Onset   Heart disease Mother    Osteoporosis Mother    Hypertension Mother    Cancer Father        lung   Diabetes Father    Cancer Sister        brain   Diabetes Sister    Cancer Brother        brain   Osteoporosis Sister    Cancer Sister        breast   Diabetes Brother    Diabetes Brother     Review of Systems: Denies fevers or chills  Physical Exam: Vital Signs BP (!) 160/75 (BP Location: Left Arm, Patient Position: Sitting, Cuff Size: Small)   Pulse 91   Wt 146 lb 3.2 oz (66.3 kg)   SpO2 96%   BMI 24.33 kg/m   Physical Exam  Constitutional:      General: Not in acute distress.    Appearance: Normal appearance. Not ill-appearing.  HENT:     Head: Normocephalic and atraumatic.  Neck:     Musculoskeletal: Normal range of motion.  Cardiovascular:     Rate and Rhythm: Normal rate Pulmonary:     Effort: Pulmonary effort is normal. No respiratory distress.  Musculoskeletal: Normal range of motion.  Lower extremities: Nonswollen, no  varicose veins noted Skin:    General: Skin is warm and dry.  Neurological:     Mental Status: Alert and oriented to person, place, and time. Mental status is at baseline.  Psychiatric:        Mood and Affect: Mood normal.        Behavior: Behavior normal.    Assessment/Plan: The patient is scheduled for closure of Mohs defect left chin, possible complex closure, possible tissue rearrangement, possible rotational flap, possible tissue matrix placement with Dr. Lovena Le.  Risks, benefits, and alternatives of procedure discussed, questions answered and consent obtained.    Smoking Status: current smoker; Counseling Given?  I discussed with the patient and patient's husband that smoking and secondhand smoking will increase the risk of postoperative complications, especially in regards to wound healing.  I recommended the patient's stop smoking as soon as possible and continue smoking cessation throughout the perioperative period.  Patient expressed understanding.  Caprini Score: 6; Risk Factors include: Age, history of COPD, history of smoking and length of planned surgery. Recommendation for mechanical prophylaxis. Encourage early ambulation.   Will send clearance to patient's PCP.  Pictures obtained: @consult$   Post-op Rx sent to pharmacy:  Tramadol, Zofran  I instructed the patient to hold her lisinopril the day of surgery and hold any vitamins or supplements at least 1 week prior to surgery.  Patient expressed understanding.  Patient was provided with the General Surgical Risk consent document and Pain Medication Agreement prior to their appointment.  They had adequate time to read through the risk consent documents and Pain Medication Agreement. We also discussed them in person together during this preop appointment. All of their questions were answered to their satisfaction.  Recommended calling if they have any further questions.  Risk consent form and Pain Medication Agreement to  be  scanned into patient's chart.  The consent was obtained with risks and complications reviewed which included bleeding, pain, scar, infection and the risk of anesthesia.  The patients questions were answered to the patients expressed satisfaction.   I discussed with the patient that she may be at increased risk of perioperative complications given her history of smoking and COPD.  Patient expressed understanding.   Electronically signed by: Clance Boll, PA-C 01/12/2023 12:56 PM

## 2023-01-16 ENCOUNTER — Encounter: Payer: Self-pay | Admitting: Family

## 2023-01-16 ENCOUNTER — Ambulatory Visit (INDEPENDENT_AMBULATORY_CARE_PROVIDER_SITE_OTHER): Payer: Medicare Other | Admitting: Family

## 2023-01-16 VITALS — BP 130/61 | HR 87 | Temp 97.3°F | Ht 65.0 in | Wt 147.4 lb

## 2023-01-16 DIAGNOSIS — C44310 Basal cell carcinoma of skin of unspecified parts of face: Secondary | ICD-10-CM | POA: Insufficient documentation

## 2023-01-16 DIAGNOSIS — M8580 Other specified disorders of bone density and structure, unspecified site: Secondary | ICD-10-CM

## 2023-01-16 DIAGNOSIS — Z23 Encounter for immunization: Secondary | ICD-10-CM

## 2023-01-16 DIAGNOSIS — I10 Essential (primary) hypertension: Secondary | ICD-10-CM

## 2023-01-16 DIAGNOSIS — F17200 Nicotine dependence, unspecified, uncomplicated: Secondary | ICD-10-CM

## 2023-01-16 DIAGNOSIS — E663 Overweight: Secondary | ICD-10-CM

## 2023-01-16 DIAGNOSIS — E785 Hyperlipidemia, unspecified: Secondary | ICD-10-CM | POA: Diagnosis not present

## 2023-01-16 DIAGNOSIS — Z0001 Encounter for general adult medical examination with abnormal findings: Secondary | ICD-10-CM | POA: Diagnosis not present

## 2023-01-16 DIAGNOSIS — C44319 Basal cell carcinoma of skin of other parts of face: Secondary | ICD-10-CM

## 2023-01-16 DIAGNOSIS — Z122 Encounter for screening for malignant neoplasm of respiratory organs: Secondary | ICD-10-CM

## 2023-01-16 DIAGNOSIS — Z01818 Encounter for other preprocedural examination: Secondary | ICD-10-CM | POA: Diagnosis not present

## 2023-01-16 DIAGNOSIS — Z0000 Encounter for general adult medical examination without abnormal findings: Secondary | ICD-10-CM

## 2023-01-16 DIAGNOSIS — J449 Chronic obstructive pulmonary disease, unspecified: Secondary | ICD-10-CM | POA: Diagnosis not present

## 2023-01-16 DIAGNOSIS — F17210 Nicotine dependence, cigarettes, uncomplicated: Secondary | ICD-10-CM | POA: Diagnosis not present

## 2023-01-16 NOTE — Progress Notes (Signed)
Subjective:    Patient ID: Brianna [name], female    DOB: 1951-09-07, 72 y.o.   MRN: [id number]  Chief Complaint  Patient presents with   Medical Management of Chronic Issues   Pt presents to the office  today for  chronic follow up and pre op exam.  She is scheduled for closure of Moh defect.   She has COPD and she had decreased to 1/2  pack a day. States she has intermittent SOB with exertion. She uses Spiriva daily and albuterol as needed.    She osteopenia and takes calcium and vit d daily. She walks daily for 30 mins a day. Last Dexa scan 11/23/20. Hypertension This is a chronic problem. The current episode started more than 1 year ago. The problem has been resolved since onset. The problem is controlled. Associated symptoms include malaise/fatigue and shortness of breath. Pertinent negatives include no peripheral edema. Risk factors for coronary artery disease include dyslipidemia, sedentary lifestyle and smoking/tobacco exposure. The current treatment provides moderate improvement.  Hyperlipidemia This is a chronic problem. The current episode started more than 1 year ago. The problem is controlled. Recent lipid tests were reviewed and are normal. Associated symptoms include shortness of breath. Current antihyperlipidemic treatment includes statins. The current treatment provides moderate improvement of lipids. Risk factors for coronary artery disease include dyslipidemia, hypertension, a sedentary lifestyle and post-menopausal.  Nicotine Dependence Presents for follow-up visit. Her urge triggers include company of smokers. The symptoms have been stable. She smokes < 1/2 a pack of cigarettes per day.      Review of Systems  Constitutional:  Positive for malaise/fatigue.  Respiratory:  Positive for shortness of breath.   All other systems reviewed and are negative.  Family History  Problem Relation Age of Onset   Heart disease Mother    Osteoporosis Mother    Hypertension Mother     Cancer Father        lung   Diabetes Father    Cancer Sister        brain   Diabetes Sister    Cancer Brother        brain   Osteoporosis Sister    Cancer Sister        breast   Diabetes Brother    Diabetes Brother    Social History   Socioeconomic History   Marital status: Married    Spouse name: [name]   Number of children: 2   Years of education: Not on file   Highest education level: Bachelor's degree (e.g., BA, AB, BS)  Occupational History   Occupation: Retired    Fish farm manager: [employer]    Comment: [employer]  Tobacco Use   Smoking status: Heavy Smoker    Packs/day: 1.50    Years: 46.00    Total pack years: 69.00    Types: Cigarettes   Smokeless tobacco: Never  Vaping Use   Vaping Use: Never used  Substance and Sexual Activity   Alcohol use: Yes    Comment: occasional   Drug use: No   Sexual activity: Not Currently  Other Topics Concern   Not on file  Social History Narrative   Brianna [name] is retired and lives with husband [name]. They have 2 children that they see regularly as well as grandchildren. She likes to read and spend time with the grandchildren. They have a dog that she walks multiple times per day for exercise.    Social Determinants of Health   Financial Resource Strain:  Low Risk  (04/07/2022)   Overall Financial Resource Strain (CARDIA)    Difficulty of Paying Living Expenses: Not hard at all  Food Insecurity: No Food Insecurity (04/07/2022)   Hunger Vital Sign    Worried About Running Out of Food in the Last Year: Never true    Ran Out of Food in the Last Year: Never true  Transportation Needs: No Transportation Needs (04/07/2022)   PRAPARE - Hydrologist (Medical): No    Lack of Transportation (Non-Medical): No  Physical Activity: Sufficiently Active (04/07/2022)   Exercise Vital Sign    Days of Exercise per Week: 5 days    Minutes of Exercise per Session: 30 min  Stress: No Stress Concern Present (04/07/2022)    [place]    Feeling of Stress : Not at all  Social Connections: [place] (04/07/2022)   Social Connection and Isolation Panel [NHANES]    Frequency of Communication with Friends and Family: More than three times a week    Frequency of Social Gatherings with Friends and Family: More than three times a week    Attends Religious Services: 1 to 4 times per year    Active Member of [employer] or Organizations: Yes    Attends Archivist Meetings: 1 to 4 times per year    Marital Status: Married        Objective:   Physical Exam Vitals reviewed.  Constitutional:      General: She is not in acute distress.    Appearance: She is well-developed.  HENT:     Head: Normocephalic and atraumatic.     Right Ear: Tympanic membrane normal.     Left Ear: Tympanic membrane normal.  Eyes:     Pupils: Pupils are equal, round, and reactive to light.  Neck:     Thyroid: No thyromegaly.  Cardiovascular:     Rate and Rhythm: Normal rate and regular rhythm.     Heart sounds: Normal heart sounds. No murmur heard. Pulmonary:     Effort: Pulmonary effort is normal. No respiratory distress.     Breath sounds: Rhonchi present. No wheezing.  Abdominal:     General: Bowel sounds are normal. There is no distension.     Palpations: Abdomen is soft.     Tenderness: There is no abdominal tenderness.  Musculoskeletal:        General: No tenderness. Normal range of motion.     Cervical back: Normal range of motion and neck supple.  Skin:    General: Skin is warm and dry.          Comments: Scabbed lesion on chin  Neurological:     Mental Status: She is alert and oriented to person, place, and time.     Cranial Nerves: No cranial nerve deficit.     Deep Tendon Reflexes: Reflexes are normal and symmetric.  Psychiatric:        Behavior: Behavior normal.        Thought Content: Thought content normal.        Judgment:  Judgment normal.       BP 130/61   Pulse 87   Temp (!) 97.3 F (36.3 C) (Temporal)   Ht '5\' 5"'$  (1.651 m)   Wt 147 lb 6.4 oz (66.9 kg)   BMI 24.53 kg/m      Assessment & Plan:  Brianna [name] comes in today with chief complaint of Medical Management  of Chronic Issues   Diagnosis and orders addressed:  1. Pre-op evaluation - EKG 12-Lead - CMP14+EGFR - CBC with Differential/Platelet  2. Chronic obstructive pulmonary disease, unspecified COPD type ([place]) - CMP14+EGFR - CBC with Differential/Platelet  3. Hypertension, unspecified type - CMP14+EGFR - CBC with Differential/Platelet  4. Osteopenia with high risk of fracture - CMP14+EGFR - CBC with Differential/Platelet  5. Overweight (BMI 25.0-29.9) - CMP14+EGFR - CBC with Differential/Platelet  6. Basal cell carcinoma (BCC) of skin of other part of face Forms completed for surgical clearance  - CMP14+EGFR - CBC with Differential/Platelet  7. Tobacco use disorder - CMP14+EGFR - CBC with Differential/Platelet  8. Hyperlipidemia, unspecified hyperlipidemia type - CMP14+EGFR - CBC with Differential/Platelet - Lipid panel  9. Annual physical exam - CMP14+EGFR - CBC with Differential/Platelet - Lipid panel - TSH   Labs pending Health Maintenance reviewed Diet and exercise encouraged  Follow up plan: 6 months    Evelina Dun, FNP

## 2023-01-16 NOTE — Patient Instructions (Signed)
Mohs Surgery  Mohs surgery is a procedure that is done to treat skin cancer. It is often used to treat common types of skin cancer, such as basal cell carcinoma and squamous cell carcinoma. In this procedure, cancerous skin cells are carefully cut away layer by layer. The goal is to remove all cancerous tissue and leave healthy skin. This reduces scarring and allows for a better cosmetic outcome. Mohs surgery is used to treat skin cancer in areas where it is important to save as much of the normal skin as possible. These areas include the face, nose, ears, lips, hands, and genitals. This procedure may be done if: Your skin cancer has come back after another type of treatment was done. The cancer is likely to return. The cancerous area is large. The cancerous area has edges that are not clearly defined. The cancer is growing rapidly. Tell a health care provider about: Any allergies you have. All medicines you are taking, including vitamins, herbs, eye drops, creams, and over-the-counter medicines. Any problems you or family members have had with anesthetic medicines. Any bleeding problems you have. Any surgeries you have had. Any medical conditions you have. Whether you are pregnant or may be pregnant. What are the risks? Generally, this is a safe procedure. However, problems may occur, including: Infection. Bleeding. Allergic reactions to medicines. Damage to other structures, such as nerves. What happens before the procedure? Medicines Ask your health care provider about: Changing or stopping your regular medicines. This is especially important if you are taking diabetes medicines or blood thinners. Taking medicines such as aspirin and ibuprofen. These medicines can thin your blood. Do not take these medicines unless your health care provider tells you to take them. Taking over-the-counter medicines, vitamins, herbs, and supplements. Surgery safety Ask your health care provider: How  your surgery site will be marked. What steps will be taken to help prevent infection. These steps may include: Removing hair at the surgery site. Washing skin with a germ-killing soap. Taking antibiotic medicine. What happens during the procedure? You will be given a medicine to numb the area (local anesthetic). A layer of cancerous tissue will be removed with a scalpel. The layer removed will contain a small amount of the healthy tissue surrounding the cancerous tissue. The layer of removed tissue will be checked right away under a microscope. The surgeon will note the exact location of the cancerous cells. Another layer of tissue may be removed from an area with any remaining cancerous cells. This layer will be checked in the same way. More layers of cancerous tissue may be removed, one by one, and checked until no signs of cancer remain. Depending on the size and location of the surgical wound, it may be closed with stitches (sutures) or left open to heal on its own. In some cases, a skin flap or skin graft may be needed. A bandage (dressing) will be applied to the area. The procedure may vary among health care providers and hospitals. What happens after the procedure? Return to your normal activities as told by your health care provider. Summary Mohs surgery is a procedure used to treat skin cancer on the face, ears, nose, lips, and genitals. It removes the cancerous cells while leaving as much healthy skin as possible. Generally, this is a safe procedure. However, problems may occur, including infection, bleeding, and damage to other structures, such as nerves. Follow your health care provider's instructions before the procedure. You may be asked to change or stop certain  medicines. After the procedure, you may return to your normal activities as told by your health care provider. This information is not intended to replace advice given to you by your health care provider. Make sure you  discuss any questions you have with your health care provider. Document Revised: 05/03/2021 Document Reviewed: 05/03/2021 Elsevier Patient Education  [place].

## 2023-01-17 ENCOUNTER — Encounter: Payer: Self-pay | Admitting: Surgical

## 2023-01-17 LAB — CBC WITH DIFFERENTIAL/PLATELET
Basophils Absolute: 0.1 10*3/uL (ref 0.0–0.2)
Basos: 1 %
EOS (ABSOLUTE): 0.2 10*3/uL (ref 0.0–0.4)
Eos: 2 %
Hematocrit: 42 % (ref 34.0–46.6)
Hemoglobin: 14 g/dL (ref 11.1–15.9)
Immature Grans (Abs): 0 10*3/uL (ref 0.0–0.1)
Immature Granulocytes: 0 %
Lymphocytes Absolute: 3.4 10*3/uL — ABNORMAL HIGH (ref 0.7–3.1)
Lymphs: 35 %
MCH: 30.3 pg (ref 26.6–33.0)
MCHC: 33.3 g/dL (ref 31.5–35.7)
MCV: 91 fL (ref 79–97)
Monocytes Absolute: 0.8 10*3/uL (ref 0.1–0.9)
Monocytes: 8 %
Neutrophils Absolute: 5.3 10*3/uL (ref 1.4–7.0)
Neutrophils: 54 %
Platelets: 374 10*3/uL (ref 150–450)
RBC: 4.62 x10E6/uL (ref 3.77–5.28)
RDW: 12.2 % (ref 11.7–15.4)
WBC: 9.7 10*3/uL (ref 3.4–10.8)

## 2023-01-17 LAB — CMP14+EGFR
ALT: 15 IU/L (ref 0–32)
AST: 16 IU/L (ref 0–40)
Albumin/Globulin Ratio: 1.4 (ref 1.2–2.2)
Albumin: 3.9 g/dL (ref 3.8–4.8)
Alkaline Phosphatase: 99 IU/L (ref 44–121)
BUN/Creatinine Ratio: 10 — ABNORMAL LOW (ref 12–28)
BUN: 7 mg/dL — ABNORMAL LOW (ref 8–27)
Bilirubin Total: 0.5 mg/dL (ref 0.0–1.2)
CO2: 22 mmol/L (ref 20–29)
Calcium: 9.9 mg/dL (ref 8.7–10.3)
Chloride: 101 mmol/L (ref 96–106)
Creatinine, Ser: 0.7 mg/dL (ref 0.57–1.00)
Globulin, Total: 2.7 g/dL (ref 1.5–4.5)
Glucose: 78 mg/dL (ref 70–99)
Potassium: 4.6 mmol/L (ref 3.5–5.2)
Sodium: 141 mmol/L (ref 134–144)
Total Protein: 6.6 g/dL (ref 6.0–8.5)
eGFR: 91 mL/min/{1.73_m2} (ref >59)

## 2023-01-17 LAB — LIPID PANEL
Chol/HDL Ratio: 2.5 ratio (ref 0.0–4.4)
Cholesterol, Total: 147 mg/dL (ref 100–199)
HDL: 58 mg/dL (ref >39)
LDL Chol Calc (NIH): 65 mg/dL (ref 0–99)
Triglycerides: 139 mg/dL (ref 0–149)
VLDL Cholesterol Cal: 24 mg/dL (ref 5–40)

## 2023-01-17 LAB — TSH: TSH: 0.903 u[IU]/mL (ref 0.450–4.500)

## 2023-01-17 NOTE — Progress Notes (Signed)
Surgical Clearance has been received from [facility] PA-C for patient's upcoming surgery with Dr. Lovena Le .  Patient is medically cleared.

## 2023-01-23 ENCOUNTER — Telehealth: Payer: Self-pay | Admitting: *Deleted

## 2023-01-23 NOTE — Progress Notes (Signed)
Pt reports that she has recently had and URI and still experiencing pretty bad cough causing muscular around her ribs and hard for her to take a deep breath. Advised her that we should probably rescedule this for another 1-2 weeks for her symptoms to improve. I have called and made the surgeon's office aware.

## 2023-01-23 NOTE — Telephone Encounter (Signed)
Received a call from Day surgery that patient reports respiratory symptoms concerning from an anesthesia point. Message out to [place] at the skin surgery center.

## 2023-01-26 IMAGING — MG DIGITAL SCREENING BILAT W/ CAD
4 series · 4 of 4 positions shown · non-contrast
Comparison: Previous exam(s).

CLINICAL DATA: Screening.

EXAM:
DIGITAL SCREENING BILATERAL MAMMOGRAM WITH CAD
TECHNIQUE: Bilateral screening digital craniocaudal and mediolateral oblique
mammograms were obtained. The images were evaluated with
computer-aided detection.

[R MLO]
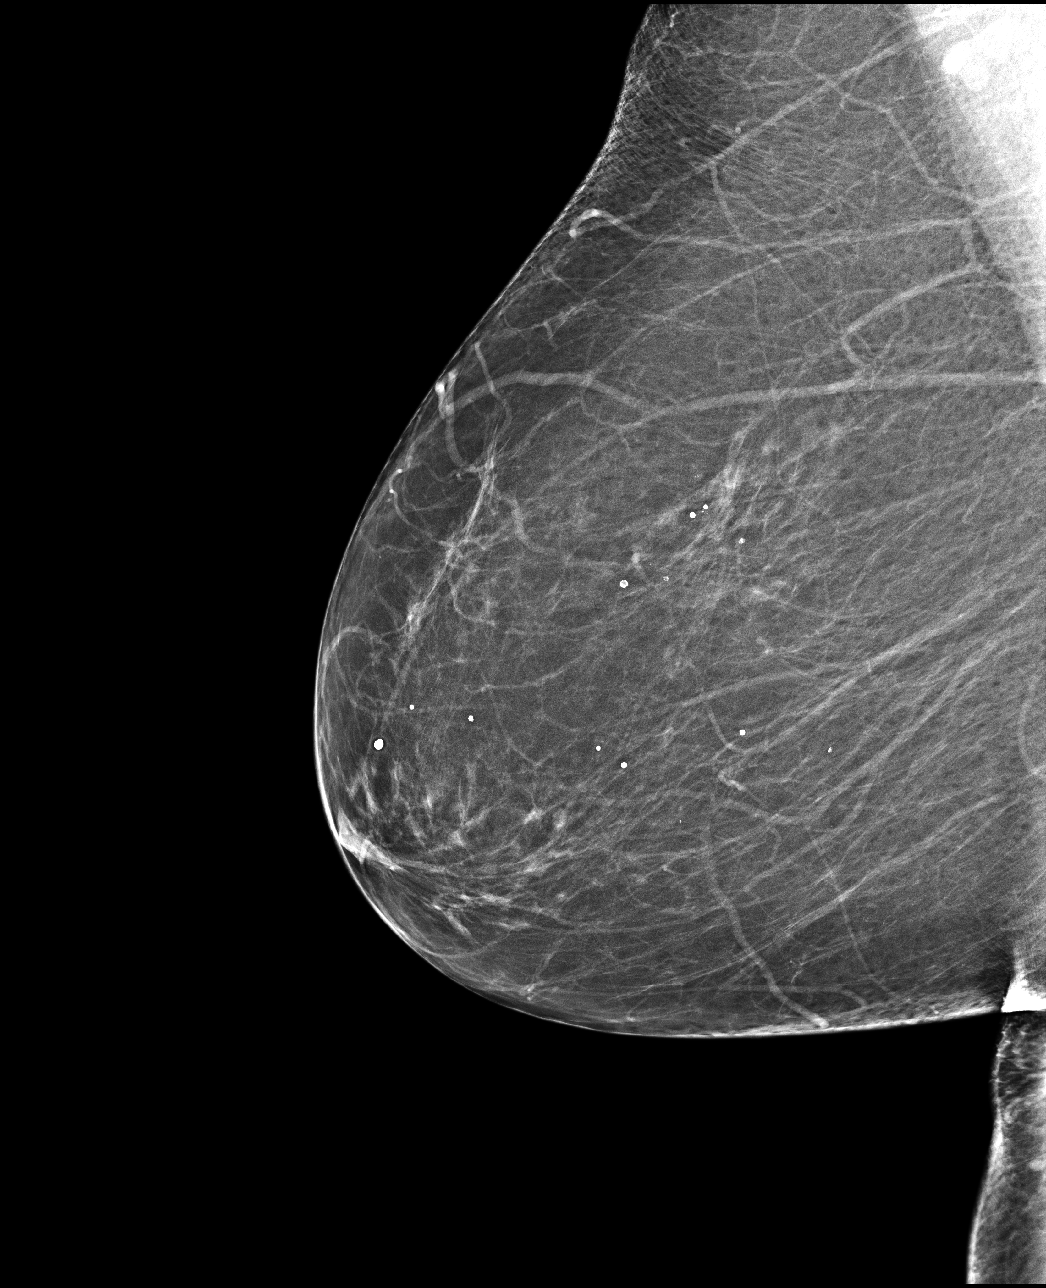

[L MLO]
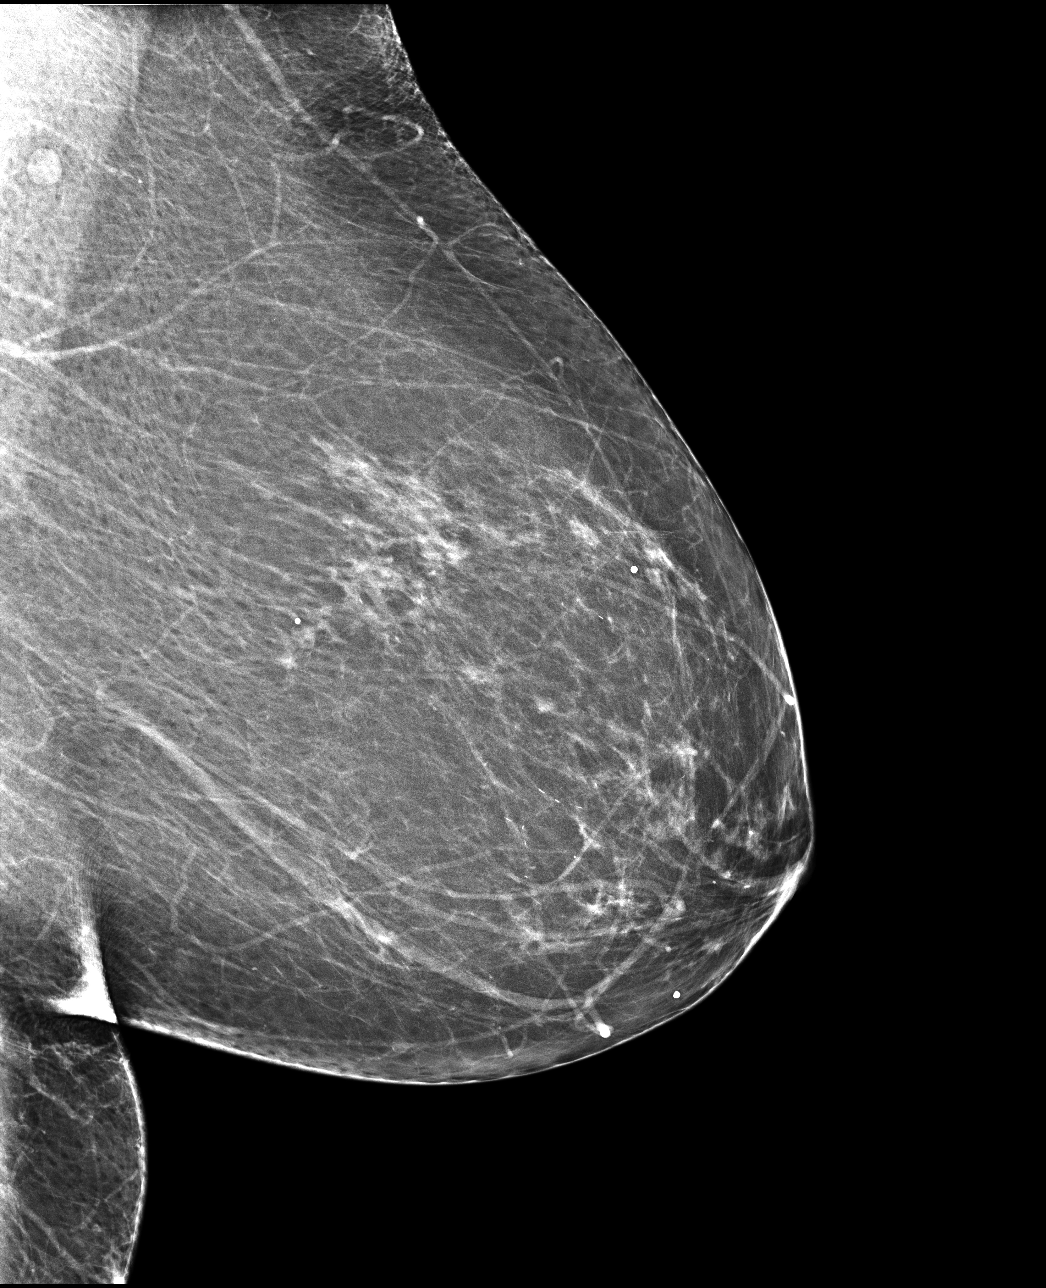

[R CC]
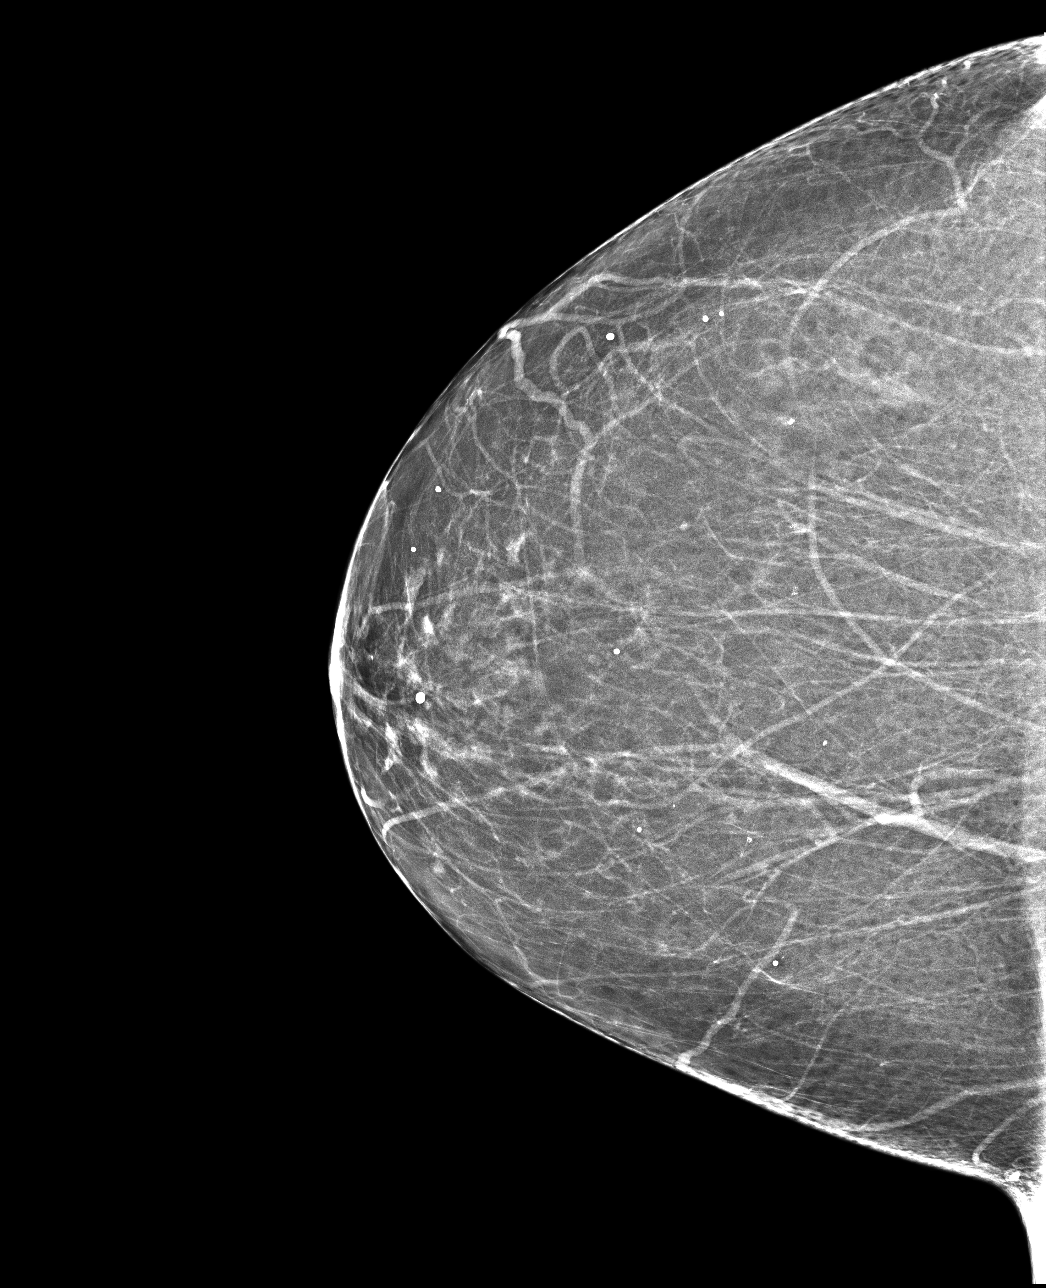

[L CC]
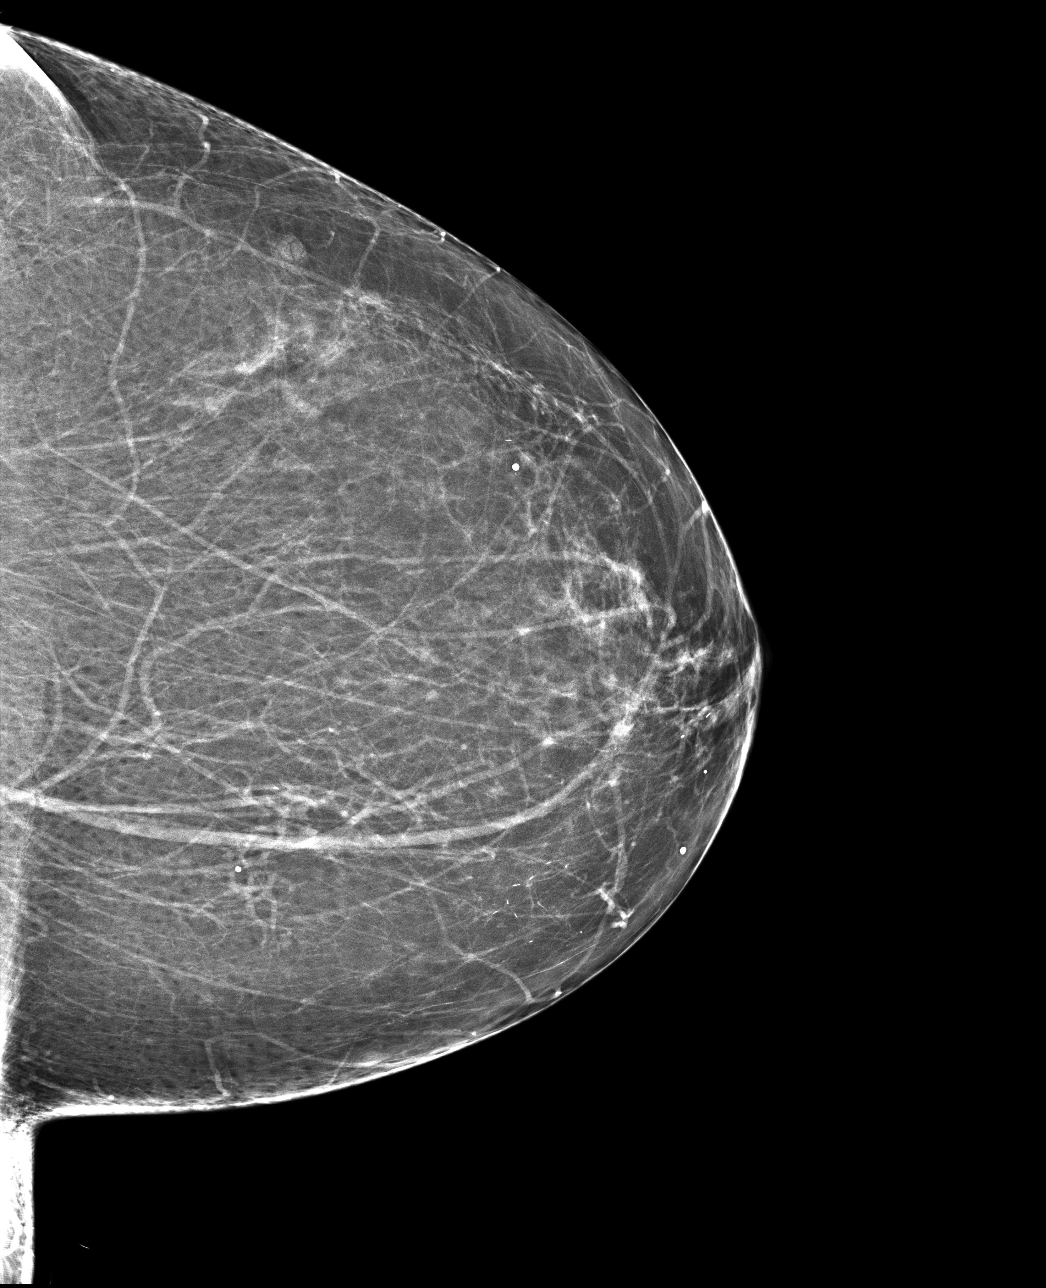

[4 of 4 positions shown; findings below may reference images not displayed]

ACR Breast Density Category b: There are scattered areas of
fibroglandular density.
FINDINGS: There are no findings suspicious for malignancy.
IMPRESSION: No mammographic evidence of malignancy. A result letter of this
screening mammogram will be mailed directly to the patient.

RECOMMENDATION:
Screening mammogram in one year. (Code:[id number]-V-ZRK)

BI-RADS CATEGORY  1: Negative.

## 2023-01-30 ENCOUNTER — Other Ambulatory Visit: Payer: Self-pay

## 2023-01-30 ENCOUNTER — Encounter (HOSPITAL_BASED_OUTPATIENT_CLINIC_OR_DEPARTMENT_OTHER): Payer: Self-pay | Admitting: Plastic Surgery

## 2023-02-05 DIAGNOSIS — C44319 Basal cell carcinoma of skin of other parts of face: Secondary | ICD-10-CM | POA: Diagnosis not present

## 2023-02-06 ENCOUNTER — Ambulatory Visit (HOSPITAL_BASED_OUTPATIENT_CLINIC_OR_DEPARTMENT_OTHER): Payer: Medicare Other | Admitting: Anesthesiology

## 2023-02-06 ENCOUNTER — Ambulatory Visit (HOSPITAL_BASED_OUTPATIENT_CLINIC_OR_DEPARTMENT_OTHER)
Admission: RE | Admit: 2023-02-06 | Discharge: 2023-02-06 | Disposition: A | Discharge status: Home / Self Care | Payer: Medicare Other | Attending: Plastic Surgery | Admitting: Plastic Surgery

## 2023-02-06 ENCOUNTER — Encounter (HOSPITAL_BASED_OUTPATIENT_CLINIC_OR_DEPARTMENT_OTHER)
Admission: RE | Disposition: A | Discharge status: Home / Self Care | Payer: Self-pay | Source: Home / Self Care | Attending: Plastic Surgery

## 2023-02-06 ENCOUNTER — Other Ambulatory Visit: Payer: Self-pay

## 2023-02-06 ENCOUNTER — Encounter (HOSPITAL_BASED_OUTPATIENT_CLINIC_OR_DEPARTMENT_OTHER): Payer: Self-pay | Admitting: Plastic Surgery

## 2023-02-06 DIAGNOSIS — Z801 Family history of malignant neoplasm of trachea, bronchus and lung: Secondary | ICD-10-CM | POA: Insufficient documentation

## 2023-02-06 DIAGNOSIS — F17210 Nicotine dependence, cigarettes, uncomplicated: Secondary | ICD-10-CM | POA: Diagnosis not present

## 2023-02-06 DIAGNOSIS — Z85828 Personal history of other malignant neoplasm of skin: Secondary | ICD-10-CM | POA: Insufficient documentation

## 2023-02-06 DIAGNOSIS — M952 Other acquired deformity of head: Secondary | ICD-10-CM

## 2023-02-06 DIAGNOSIS — Z428 Encounter for other plastic and reconstructive surgery following medical procedure or healed injury: Secondary | ICD-10-CM | POA: Insufficient documentation

## 2023-02-06 DIAGNOSIS — Z8249 Family history of ischemic heart disease and other diseases of the circulatory system: Secondary | ICD-10-CM | POA: Diagnosis not present

## 2023-02-06 DIAGNOSIS — I10 Essential (primary) hypertension: Secondary | ICD-10-CM | POA: Diagnosis not present

## 2023-02-06 DIAGNOSIS — Z808 Family history of malignant neoplasm of other organs or systems: Secondary | ICD-10-CM | POA: Diagnosis not present

## 2023-02-06 DIAGNOSIS — Z803 Family history of malignant neoplasm of breast: Secondary | ICD-10-CM | POA: Diagnosis not present

## 2023-02-06 DIAGNOSIS — J449 Chronic obstructive pulmonary disease, unspecified: Secondary | ICD-10-CM | POA: Insufficient documentation

## 2023-02-06 DIAGNOSIS — Z01818 Encounter for other preprocedural examination: Secondary | ICD-10-CM

## 2023-02-06 HISTORY — DX: Other specified postprocedural states: R11.2

## 2023-02-06 HISTORY — DX: Hyperlipidemia, unspecified: E78.5

## 2023-02-06 HISTORY — DX: Other specified postprocedural states: Z98.890

## 2023-02-06 HISTORY — PX: PRIMARY CLOSURE: SHX6224

## 2023-02-06 HISTORY — DX: Essential (primary) hypertension: I10

## 2023-02-06 SURGERY — PRIMARY CLOSURE
Anesthesia: General | Site: Chin | Laterality: Left

## 2023-02-06 MED ORDER — SUGAMMADEX SODIUM 500 MG/5ML IV SOLN
INTRAVENOUS | Status: AC
  Filled 2023-02-06: qty 5

## 2023-02-06 MED ORDER — 0.9 % SODIUM CHLORIDE (POUR BTL) OPTIME
TOPICAL | Status: DC | PRN
  Administered 2023-02-06: 200 mL

## 2023-02-06 MED ORDER — LIDOCAINE-EPINEPHRINE 1 %-1:100000 IJ SOLN
INTRAMUSCULAR | Status: DC | PRN
  Administered 2023-02-06: 10 mL via INTRAMUSCULAR

## 2023-02-06 MED ORDER — CHLORHEXIDINE GLUCONATE CLOTH 2 % EX PADS: 6.0000 | MEDICATED_PAD | Freq: Once | CUTANEOUS | Status: DC

## 2023-02-06 MED ORDER — PHENYLEPHRINE HCL (PRESSORS) 10 MG/ML IV SOLN
INTRAVENOUS | Status: DC | PRN
  Administered 2023-02-06 (×2): 80 ug via INTRAVENOUS
  Administered 2023-02-06: 160 ug via INTRAVENOUS
  Administered 2023-02-06: 80 ug via INTRAVENOUS
  Administered 2023-02-06: 160 ug via INTRAVENOUS

## 2023-02-06 MED ORDER — MIDAZOLAM HCL 2 MG/2ML IJ SOLN
INTRAMUSCULAR | Status: AC
  Filled 2023-02-06: qty 2

## 2023-02-06 MED ORDER — FENTANYL CITRATE (PF) 100 MCG/2ML IJ SOLN: 25.0000 ug | INTRAMUSCULAR | Status: DC | PRN

## 2023-02-06 MED ORDER — ACETAMINOPHEN 10 MG/ML IV SOLN: 1000.0000 mg | Freq: Once | INTRAVENOUS | Status: DC | PRN

## 2023-02-06 MED ORDER — FENTANYL CITRATE (PF) 100 MCG/2ML IJ SOLN
INTRAMUSCULAR | Status: AC
  Filled 2023-02-06: qty 2

## 2023-02-06 MED ORDER — ONDANSETRON HCL 4 MG/2ML IJ SOLN
INTRAMUSCULAR | Status: AC
  Filled 2023-02-06: qty 2

## 2023-02-06 MED ORDER — CEFAZOLIN SODIUM-DEXTROSE 2-4 GM/100ML-% IV SOLN
INTRAVENOUS | Status: AC
  Filled 2023-02-06: qty 100

## 2023-02-06 MED ORDER — PHENYLEPHRINE 80 MCG/ML (10ML) SYRINGE FOR IV PUSH (FOR BLOOD PRESSURE SUPPORT)
PREFILLED_SYRINGE | INTRAVENOUS | Status: AC
  Filled 2023-02-06: qty 30

## 2023-02-06 MED ORDER — DEXAMETHASONE SODIUM PHOSPHATE 4 MG/ML IJ SOLN
INTRAMUSCULAR | Status: DC | PRN
  Administered 2023-02-06: 5 mg via INTRAVENOUS

## 2023-02-06 MED ORDER — MIDAZOLAM HCL 5 MG/5ML IJ SOLN
INTRAMUSCULAR | Status: DC | PRN
  Administered 2023-02-06: 1 mg via INTRAVENOUS

## 2023-02-06 MED ORDER — LACTATED RINGERS IV SOLN: INTRAVENOUS | Status: DC

## 2023-02-06 MED ORDER — EPHEDRINE SULFATE (PRESSORS) 50 MG/ML IJ SOLN
INTRAMUSCULAR | Status: DC | PRN
  Administered 2023-02-06 (×2): 5 mg via INTRAVENOUS

## 2023-02-06 MED ORDER — LIDOCAINE HCL (CARDIAC) PF 100 MG/5ML IV SOSY
PREFILLED_SYRINGE | INTRAVENOUS | Status: DC | PRN
  Administered 2023-02-06: 60 mg via INTRAVENOUS

## 2023-02-06 MED ORDER — EPHEDRINE 5 MG/ML INJ
INTRAVENOUS | Status: AC
  Filled 2023-02-06: qty 5

## 2023-02-06 MED ORDER — ONDANSETRON HCL 4 MG/2ML IJ SOLN
INTRAMUSCULAR | Status: DC | PRN
  Administered 2023-02-06: 4 mg via INTRAVENOUS

## 2023-02-06 MED ORDER — CEFAZOLIN SODIUM-DEXTROSE 2-4 GM/100ML-% IV SOLN
2.0000 g | INTRAVENOUS | Status: AC
  Administered 2023-02-06: 2 g via INTRAVENOUS

## 2023-02-06 MED ORDER — PROPOFOL 10 MG/ML IV BOLUS
INTRAVENOUS | Status: DC | PRN
  Administered 2023-02-06: 120 mg via INTRAVENOUS
  Administered 2023-02-06: 50 mg via INTRAVENOUS

## 2023-02-06 MED ORDER — SUGAMMADEX SODIUM 500 MG/5ML IV SOLN
INTRAVENOUS | Status: DC | PRN
  Administered 2023-02-06: 150 mg via INTRAVENOUS

## 2023-02-06 MED ORDER — FENTANYL CITRATE (PF) 100 MCG/2ML IJ SOLN
INTRAMUSCULAR | Status: DC | PRN
  Administered 2023-02-06: 100 ug via INTRAVENOUS

## 2023-02-06 SURGICAL SUPPLY — 45 items
BLADE HEX COATED 2.75 (ELECTRODE) IMPLANT
CANISTER SUCT 1200ML W/VALVE (MISCELLANEOUS) ×1 IMPLANT
CORD BIPOLAR FORCEPS 12FT (ELECTRODE) IMPLANT
DRSG MEPILEX POST OP 4X8 (GAUZE/BANDAGES/DRESSINGS) IMPLANT
ELECT COATED BLADE 2.86 ST (ELECTRODE) IMPLANT
ELECT NDL BLADE 2-5/6 (NEEDLE) IMPLANT
ELECT NEEDLE BLADE 2-5/6 (NEEDLE) ×1 IMPLANT
ELECT REM PT RETURN 9FT ADLT (ELECTROSURGICAL) ×1
ELECTRODE REM PT RTRN 9FT ADLT (ELECTROSURGICAL) ×1 IMPLANT
GAUZE 4X4 16PLY ~~LOC~~+RFID DBL (SPONGE) IMPLANT
GAUZE VASELINE FOILPK 1/2 X 72 (GAUZE/BANDAGES/DRESSINGS) IMPLANT
GLOVE BIO SURGEON STRL SZ 6.5 (GLOVE) IMPLANT
GLOVE BIO SURGEON STRL SZ8 (GLOVE) ×1 IMPLANT
GLOVE BIOGEL M STRL SZ7.5 (GLOVE) ×1 IMPLANT
GLOVE BIOGEL PI IND STRL 7.0 (GLOVE) ×1 IMPLANT
GLOVE BIOGEL PI IND STRL 7.5 (GLOVE) ×1 IMPLANT
GOWN STRL REUS W/ TWL LRG LVL3 (GOWN DISPOSABLE) ×2 IMPLANT
GOWN STRL REUS W/TWL LRG LVL3 (GOWN DISPOSABLE) ×2
MARKER SKIN DUAL TIP RULER LAB (MISCELLANEOUS) IMPLANT
MORCELLS MYRIAD PWDR FINE 200 (Miscellaneous) IMPLANT
NDL HYPO 27GX1-1/4 (NEEDLE) ×1 IMPLANT
NEEDLE HYPO 27GX1-1/4 (NEEDLE) ×1 IMPLANT
NS IRRIG 1000ML POUR BTL (IV SOLUTION) IMPLANT
PACK BASIN DAY SURGERY FS (CUSTOM PROCEDURE TRAY) ×1 IMPLANT
PACK ENT DAY SURGERY (CUSTOM PROCEDURE TRAY) ×1 IMPLANT
PENCIL SMOKE EVACUATOR (MISCELLANEOUS) ×1 IMPLANT
SHEILD EYE MED CORNL SHD 22X21 (OPHTHALMIC RELATED)
SHIELD EYE MED CORNL SHD 22X21 (OPHTHALMIC RELATED) IMPLANT
SLEEVE SCD COMPRESS KNEE MED (STOCKING) ×1 IMPLANT
SPIKE FLUID TRANSFER (MISCELLANEOUS) IMPLANT
SPONGE GAUZE 2X2 8PLY STRL LF (GAUZE/BANDAGES/DRESSINGS) IMPLANT
SPONGE T-LAP 4X18 ~~LOC~~+RFID (SPONGE) IMPLANT
STAPLER VISISTAT 35W (STAPLE) IMPLANT
SURGILUBE 2OZ TUBE FLIPTOP (MISCELLANEOUS) IMPLANT
SUT ETHILON 3 0 PS 1 (SUTURE) IMPLANT
SUT MNCRL AB 4-0 PS2 18 (SUTURE) IMPLANT
SUT MON AB 4-0 PC3 18 (SUTURE) IMPLANT
SUT PDS AB 4-0 P3 18 (SUTURE) IMPLANT
SUT PDS AB 4-0 SH 27 (SUTURE) IMPLANT
SUT PROLENE 5 0 P 3 (SUTURE) IMPLANT
SUT PROLENE 5 0 PS 2 (SUTURE) IMPLANT
SUT PROLENE 6 0 P 1 18 (SUTURE) IMPLANT
SYR BULB EAR ULCER 3OZ GRN STR (SYRINGE) IMPLANT
TOWEL GREEN STERILE FF (TOWEL DISPOSABLE) ×2 IMPLANT
TRAY DSU PREP LF (CUSTOM PROCEDURE TRAY) ×1 IMPLANT

## 2023-02-06 NOTE — Anesthesia Procedure Notes (Addendum)
Procedure Name: Intubation Date/Time: 02/06/2023 2:24 AM  Performed by: Willa Frater, CRNAPre-anesthesia Checklist: Patient identified, Emergency Drugs available, Suction available and Patient being monitored Patient Re-evaluated:Patient Re-evaluated prior to induction Oxygen Delivery Method: Circle system utilized Preoxygenation: Pre-oxygenation with 100% oxygen Induction Type: IV induction Ventilation: Mask ventilation without difficulty Laryngoscope Size: Mac and 3 Grade View: Grade II Tube type: Oral Tube size: 7.0 mm Number of attempts: 1 Airway Equipment and Method: Stylet and Oral airway Placement Confirmation: ETT inserted through vocal cords under direct vision, positive ETCO2 and breath sounds checked- equal and bilateral Secured at: 21 cm Tube secured with: Tape Dental Injury: Teeth and Oropharynx as per pre-operative assessment

## 2023-02-06 NOTE — Transfer of Care (Signed)
Immediate Anesthesia Transfer of Care Note  Patient: Brianna [name]  Procedure(s) Performed: Closure of Mohs defect, left chin. Possible complex closure, possible local tissue rearrangement, possible rotational flap, possible tissue matrix ( Myriad or Acell) (Left: Chin) APPLICATION OF SKIN SUBSTITUTE (Left: Chin)  Patient Location: PACU  Anesthesia Type:General  Level of Consciousness: drowsy  Airway & Oxygen Therapy: Patient Spontanous Breathing and Patient connected to face mask oxygen  Post-op Assessment: Report given to RN and Post -op Vital signs reviewed and stable  Post vital signs: Reviewed and stable  Last Vitals:  Vitals Value Taken Time  BP 157/66 02/06/23 1657  Temp 36.3 C 02/06/23 1657  Pulse 90 02/06/23 1700  Resp 18 02/06/23 1700  SpO2 100 % 02/06/23 1700  Vitals shown include unvalidated device data.  Last Pain:  Vitals:   02/06/23 1217  TempSrc: Oral  PainSc: 0-No pain      Patients Stated Pain Goal: 3 ([id number])  Complications: No notable events documented.

## 2023-02-06 NOTE — Interval H&P Note (Signed)
History and Physical Interval Note: No change in physical exam or indicatio for surgery. Discussed with the patient and her daughter who request that I proceed with repair of the chin Mohs surgery defect.  02/06/2023 2:11 PM  Brianna [name]  has presented today for surgery, with the diagnosis of skin lesion left chin.  The various methods of treatment have been discussed with the patient and family. After consideration of risks, benefits and other options for treatment, the patient has consented to  Procedure(s): Closure of Mohs defect, left chin. Possible complex closure, possible local tissue rearrangement, possible rotational flap, possible tissue matrix ( Myriad or Acell) (Left) APPLICATION OF SKIN SUBSTITUTE (Left) as a surgical intervention.  The patient's history has been reviewed, patient examined, no change in status, stable for surgery.  I have reviewed the patient's chart and labs.  Questions were answered to the patient's satisfaction.     Camillia Herter

## 2023-02-06 NOTE — Op Note (Signed)
DATE OF OPERATION: 02/06/2023  LOCATION: [facility] operating Room  PREOPERATIVE DIAGNOSIS: 4 x 4 centimeter Mohs defect, left chin  POSTOPERATIVE DIAGNOSIS: Same  PROCEDURE: Closure of defect with local tissue rearrangement and placement of tissue matrix  SURGEON: Jeanann Lewandowsky, MD  ASSISTANT: Donnamarie Rossetti, primary, Krista Blue  EBL: 10 cc  CONDITION: Stable  COMPLICATIONS: None  INDICATION: The patient, Brianna [name], is a 73 y.o. female born on 06-07-1951, is here for treatment of defect on the chin secondary to resection of a skin cancer.   PROCEDURE DETAILS:  The patient was seen prior to surgery and marked.   IV antibiotics were given. The patient was taken to the operating room and given a general anesthetic. A standard time out was performed and all information was confirmed by those in the room. SCDs were placed.   The lower face and upper chest were prepped and draped in the usual sterile manner.  I began the closure by undermining the tissue on the lateral and inferior borders I elevated tissue laterally approximately 5 cm on each side and approximately 2 cm down the on the inferior aspect of the chin.  I was still unable to entirely closed the wound I elected to make 5 cm incisions on the medial and lateral aspects of the of the wound to allow for advancement of the skin this still did not completely close the wound and created too much downward pull on the lateral aspect of the lip.  I further mobilized the tissue over the chin and upper neck to allow it to close the lower aspect of the wound.  I used 4-0 PDS to attach the fascia of the skin to the mental muscle to prevent retraction of the skin and depression of the lip.  I then closed the incisions with interrupted 4-0 Monocryl sutures and 5-0 Prolene sutures.  There is still a small 1 x 1 cm defect in the central portion of the wound I placed myriad powder in this and covered it with a 1 x 1 cm portion of Adaptic which  was sutured in place with Prolene sutures a dressing with surgical lube was placed over the entire reconstruction. The patient was allowed to wake up and taken to recovery room in stable condition at the end of the case. The family was notified at the end of the case.  I reiterated to Mrs. Dworshak daughter the importance of stopping smoking as I do believe that smoking will be incredibly detrimental to this repair.  She understood and said that she will work with her mother as much as possible.  The advanced practice practitioner (APP) assisted throughout the case.  The APP was essential in retraction and counter traction when needed to make the case progress smoothly.  This retraction and assistance made it possible to see the tissue plans for the procedure.  The assistance was needed for blood control, tissue re-approximation and assisted with closure of the incision site.

## 2023-02-06 NOTE — Anesthesia Postprocedure Evaluation (Signed)
Anesthesia Post Note  Patient: Brianna [name]  Procedure(s) Performed: Closure of Mohs defect, left chin, complex closure, local tissue rearrangement, rotational flap (Left: Chin) APPLICATION OF MYRIAD (Left: Chin)     Patient location during evaluation: PACU Anesthesia Type: General Level of consciousness: awake and alert Pain management: pain level controlled Vital Signs Assessment: post-procedure vital signs reviewed and stable Respiratory status: spontaneous breathing, nonlabored ventilation, respiratory function stable and patient connected to nasal cannula oxygen Cardiovascular status: blood pressure returned to baseline and stable Postop Assessment: no apparent nausea or vomiting Anesthetic complications: no   No notable events documented.  Last Vitals:  Vitals:   02/06/23 1700 02/06/23 1715  BP: (!) 154/68 (!) 152/69  Pulse: 90 83  Resp: 18 20  Temp:    SpO2: 100% 94%    Last Pain:  Vitals:   02/06/23 1715  TempSrc:   PainSc: 0-No pain                 Adriel Desrosier S

## 2023-02-06 NOTE — Anesthesia Preprocedure Evaluation (Addendum)
Anesthesia Evaluation  Patient identified by MRN, date of birth, ID band Patient awake    Reviewed: Allergy & Precautions, NPO status , Patient's Chart, lab work & pertinent test results  History of Anesthesia Complications (+) PONV and history of anesthetic complications  Airway Mallampati: II  TM Distance: >3 FB Neck ROM: Full    Dental no notable dental hx.    Pulmonary COPD,  COPD inhaler, Current Smoker   Pulmonary exam normal        Cardiovascular hypertension, Pt. on medications  Rhythm:Regular Rate:Normal     Neuro/Psych negative neurological ROS  negative psych ROS   GI/Hepatic negative GI ROS, Neg liver ROS,,,  Endo/Other  negative endocrine ROS    Renal/GU negative Renal ROS  negative genitourinary   Musculoskeletal negative musculoskeletal ROS (+)    Abdominal Normal abdominal exam  (+)   Peds  Hematology negative hematology ROS (+)   Anesthesia Other Findings   Reproductive/Obstetrics                             Anesthesia Physical Anesthesia Plan  ASA: 2  Anesthesia Plan: General   Post-op Pain Management:    Induction: Intravenous  PONV Risk Score and Plan: 3 and Ondansetron, Dexamethasone, Treatment may vary due to age or medical condition and Midazolam  Airway Management Planned: Mask and Oral ETT  Additional Equipment: None  Intra-op Plan:   Post-operative Plan: Extubation in OR  Informed Consent: I have reviewed the patients History and Physical, chart, labs and discussed the procedure including the risks, benefits and alternatives for the proposed anesthesia with the patient or authorized representative who has indicated his/her understanding and acceptance.     Dental advisory given  Plan Discussed with: CRNA  Anesthesia Plan Comments:        Anesthesia Quick Evaluation

## 2023-02-06 NOTE — Discharge Instructions (Addendum)
Diet: High protein, low sugar.  Wound care: Your Moh's excision site was partially closed with sutures, however there is still a wound that will need to heal from the bottom up. We placed a tissue substitute which we then followed with a clear Adaptic dressing that has been secured with stitches. Please leave this in place. This has been covered with KY jelly and then a silicone bordered white dressing was placed over top.  Starting 02/07/2023: Peel back the overlying white bandage and place KY jelly over the triangular shaped clear dressing over the small wound.  Then re-secure the overlying white bandage.  You can obtain KY jelly over-the-counter at a pharmacy.    Activity: As tolerated.  Mild drainage from underneath the dressing is OK. However, please call the office should you have severe pain or swelling, surrounding redness or malodor, wound dehiscence (if it completely opens), or if you have any fevers.       Post Anesthesia Home Care Instructions  Activity: Get plenty of rest for the remainder of the day. A responsible individual must stay with you for 24 hours following the procedure.  For the next 24 hours, DO NOT: -Drive a car -Paediatric nurse -Drink alcoholic beverages -Take any medication unless instructed by your physician -Make any legal decisions or sign important papers.  Meals: Start with liquid foods such as gelatin or soup. Progress to regular foods as tolerated. Avoid greasy, spicy, heavy foods. If nausea and/or vomiting occur, drink only clear liquids until the nausea and/or vomiting subsides. Call your physician if vomiting continues.  Special Instructions/Symptoms: Your throat may feel dry or sore from the anesthesia or the breathing tube placed in your throat during surgery. If this causes discomfort, gargle with warm salt water. The discomfort should disappear within 24 hours.  If you had a scopolamine patch placed behind your ear for the management of post-  operative nausea and/or vomiting:  1. The medication in the patch is effective for 72 hours, after which it should be removed.  Wrap patch in a tissue and discard in the trash. Wash hands thoroughly with soap and water. 2. You may remove the patch earlier than 72 hours if you experience unpleasant side effects which may include dry mouth, dizziness or visual disturbances. 3. Avoid touching the patch. Wash your hands with soap and water after contact with the patch.

## 2023-02-07 ENCOUNTER — Encounter: Payer: Medicare Other | Admitting: Plastic Surgery

## 2023-02-08 ENCOUNTER — Encounter: Payer: Self-pay | Admitting: Plastic Surgery

## 2023-02-08 ENCOUNTER — Ambulatory Visit (INDEPENDENT_AMBULATORY_CARE_PROVIDER_SITE_OTHER): Payer: Medicare Other | Admitting: Plastic Surgery

## 2023-02-08 VITALS — BP 151/69 | HR 73

## 2023-02-08 DIAGNOSIS — Z9889 Other specified postprocedural states: Secondary | ICD-10-CM

## 2023-02-08 DIAGNOSIS — L989 Disorder of the skin and subcutaneous tissue, unspecified: Secondary | ICD-10-CM

## 2023-02-08 NOTE — Progress Notes (Signed)
Brianna [name] presents today for evaluation postop day 2 from repair of her Mohs defect.  She has no specific complaints although she notes significant bruising after the procedure.  She states that she has taken 2 Tylenol since surgery.  On evaluation the incisions all look good and are clean dry and intact.  There may be a little bit of epidermal lysis just to the lateral aspect of the area where the myriad powder was placed.  The Adaptic is in place and she is doing a good job keeping the area moisturized.  Discussed with Mrs. Manley her husband and her daughter who joined by phone of the continued plan for treatment.  Mrs. Derocher will return next week for suture removal at which time we can decrease the size of the dressing and just put a small bandage over the 1 x 1 cm area of myriad.  She will follow-up with me again on Thursday for reevaluation of the wounds.

## 2023-02-09 ENCOUNTER — Encounter (HOSPITAL_BASED_OUTPATIENT_CLINIC_OR_DEPARTMENT_OTHER): Payer: Self-pay | Admitting: Plastic Surgery

## 2023-02-13 ENCOUNTER — Encounter: Payer: Self-pay | Admitting: Student

## 2023-02-13 ENCOUNTER — Ambulatory Visit (INDEPENDENT_AMBULATORY_CARE_PROVIDER_SITE_OTHER): Payer: Medicare Other | Admitting: Student

## 2023-02-13 VITALS — BP 135/72 | HR 72

## 2023-02-13 DIAGNOSIS — L989 Disorder of the skin and subcutaneous tissue, unspecified: Secondary | ICD-10-CM

## 2023-02-13 MED ORDER — CEPHALEXIN 500 MG PO CAPS: 500.0000 mg | ORAL_CAPSULE | Freq: Four times a day (QID) | ORAL | 0 refills | Status: AC

## 2023-02-13 NOTE — Progress Notes (Signed)
Patient is a 72 year old female with history of Mohs defect status post repair of her Mohs defect.  Patient underwent repair of the Mohs defect with Dr. Lovena Le on 02/06/2023.  Patient presents to the clinic today for postoperative follow-up.  Patient was last seen in clinic by Dr. Lovena Le on 02/08/2023.  At this visit, she reported she was having some significant bruising after the procedure.  On exam, the incisions looked good and they were clean dry and intact.  There was a little bit of epidermolysis just to the lateral aspect of the area where the myriad powder was placed.  The Adaptic was in place.  Plan was for patient to return in 1 week for suture removal.  Today, patient is accompanied by her husband.  She states that she is doing well.  She reports she has not had any issues or concerns.  She states that she has been applying K-Y jelly daily over the Adaptic.  She denies any fevers or chills.  On exam, patient is sitting upright in no acute distress.  There is some mild swelling and residual bruising noted near the surgical site in the inferior aspect of the chin/superior aspect of the neck.  The Adaptic over where the myriad was placed is in place with Prolene sutures.  Just left/lateral of the Adaptic, there is an area of eschar.  The incision is otherwise intact with Prolene sutures.  There is some surrounding erythema that appears to be consistent with either early cellulitis or irritation.  There is no drainage on exam.  There is very mild tenderness to palpation.   Prolene sutures to the incision were removed without difficulty.  Prolene sutures to the Adaptic were left in place.  Patient tolerated well.  I discussed with the patient and patient's husband that I will be starting her on Keflex given the erythema surrounding the incision.  We will plan to do Keflex for 5 days.  Patient denied any penicillin allergies.  I discussed with the patient and the patient's husband to apply Vaseline to  the incision daily.  I discussed with them to also put some Vaseline over the eschar just lateral to the Adaptic.  I discussed with them to continue to apply K-Y jelly over the Adaptic 2 times per day.  I discussed with them that they may cover the area with the Adaptic and the eschar with a nonstick gauze and tape.  Patient and patient's husband expressed understanding.  I discussed with the patient and the patient's husband that the area of eschar will most likely eventually slough off.  I discussed with them that it will eventually be a wound that we will have to perform wound care on.  Patient and patient's husband expressed understanding.  Plan is for patient to follow-up with Dr. Lovena Le on Thursday.  I instructed the patient and patient's husband to call if they have any concerns.  I discussed with them if the area becomes more red, more tender, if it starts to drain, if she develops any fevers or chills, or worsening symptoms, to let us know.  They expressed understanding.  Pictures were obtained of the patient and placed in the chart with the patient's or guardian's permission.

## 2023-02-14 ENCOUNTER — Other Ambulatory Visit: Payer: Self-pay | Admitting: Family

## 2023-02-14 DIAGNOSIS — E785 Hyperlipidemia, unspecified: Secondary | ICD-10-CM

## 2023-02-15 ENCOUNTER — Encounter: Payer: Self-pay | Admitting: Plastic Surgery

## 2023-02-15 ENCOUNTER — Ambulatory Visit (INDEPENDENT_AMBULATORY_CARE_PROVIDER_SITE_OTHER): Payer: Medicare Other | Admitting: Plastic Surgery

## 2023-02-15 VITALS — BP 144/72

## 2023-02-15 DIAGNOSIS — Z9889 Other specified postprocedural states: Secondary | ICD-10-CM

## 2023-02-15 DIAGNOSIS — L989 Disorder of the skin and subcutaneous tissue, unspecified: Secondary | ICD-10-CM

## 2023-02-15 NOTE — Progress Notes (Signed)
Brianna [name] returns today for evaluation of her Mohs closure of her chin.  She is doing well with no specific complaints.  No fevers chills no discomfort.  On examination the incisions are healing.  There is erythema around the area where the myriad was placed.  I believe this is most likely inflammation due to the myriad but it may be an infection so she is on antibiotics at this time.  Status post Mohs reconstruction: Will continue with the surgical lube over the myriad until next Tuesday and then take the dressing off at that time.  Will continue to follow her closely.  She and I discussed the fact that she will require some revision of her reconstruction in the future.  She is continuing to refrain from smoking.  Follow-up next week.

## 2023-02-16 ENCOUNTER — Encounter: Payer: Self-pay | Admitting: Family

## 2023-02-20 ENCOUNTER — Ambulatory Visit (INDEPENDENT_AMBULATORY_CARE_PROVIDER_SITE_OTHER): Payer: Medicare Other | Admitting: Plastic Surgery

## 2023-02-20 ENCOUNTER — Encounter: Payer: Medicare Other | Admitting: Plastic Surgery

## 2023-02-20 DIAGNOSIS — Z9889 Other specified postprocedural states: Secondary | ICD-10-CM

## 2023-02-20 DIAGNOSIS — L989 Disorder of the skin and subcutaneous tissue, unspecified: Secondary | ICD-10-CM

## 2023-02-20 NOTE — Progress Notes (Signed)
Brianna [name] returns today 2 weeks postop from closure of a large Mohs defect on her chin.  She is doing well with only complaints of tightness under her chin.  On examination her incisions are healing very well.  The Adaptic was removed and there is evidence of healing where the myriad was placed.  Status post Mohs reconstruction: Patient is doing well.  She will begin gentle scar massage and use of moisturizing cream on the wound.  She will return to see me in 3 to 4 weeks unless she needs to be seen sooner.  Any revision of scars will not occur until she is approximately 6 months postop from the original reconstruction.

## 2023-02-28 ENCOUNTER — Other Ambulatory Visit: Payer: Self-pay | Admitting: Family

## 2023-02-28 DIAGNOSIS — J449 Chronic obstructive pulmonary disease, unspecified: Secondary | ICD-10-CM

## 2023-03-14 ENCOUNTER — Ambulatory Visit (INDEPENDENT_AMBULATORY_CARE_PROVIDER_SITE_OTHER): Payer: Medicare Other | Admitting: Plastic Surgery

## 2023-03-14 ENCOUNTER — Encounter: Payer: Self-pay | Admitting: Plastic Surgery

## 2023-03-14 VITALS — BP 164/72 | HR 75

## 2023-03-14 DIAGNOSIS — Z9889 Other specified postprocedural states: Secondary | ICD-10-CM

## 2023-03-14 NOTE — Progress Notes (Signed)
Brianna [name] returns today approximately 5 weeks after repair of a Mohs defect on her chin.  She reports that she is doing well no particular complaints.  On physical exam she has made remarkable progress and is completely healed at this point.  There is still some contour defects which remain but she is not quite ready for revision at this point.  I would like to see the scar softened a bit more before we go back and try to revise her chin.  She will continue scar massage daily as well as use sunscreen on her chin.  She may apply make-up if desired.  Follow-up with me in approximately 1 month.

## 2023-04-13 ENCOUNTER — Ambulatory Visit (INDEPENDENT_AMBULATORY_CARE_PROVIDER_SITE_OTHER): Payer: Medicare Other

## 2023-04-13 VITALS — Ht 65.0 in | Wt 143.0 lb

## 2023-04-13 DIAGNOSIS — Z780 Asymptomatic menopausal state: Secondary | ICD-10-CM

## 2023-04-13 DIAGNOSIS — Z0000 Encounter for general adult medical examination without abnormal findings: Secondary | ICD-10-CM

## 2023-04-13 DIAGNOSIS — Z1231 Encounter for screening mammogram for malignant neoplasm of breast: Secondary | ICD-10-CM

## 2023-04-13 NOTE — Patient Instructions (Signed)
[name] , Thank you for taking time to come for your Medicare Wellness Visit. I appreciate your ongoing commitment to your health goals. Please review the following plan we discussed and let me know if I can assist you in the future.   These are the goals we discussed:  Goals       Exercise 3x per week (30 min per time)      She would like to get more active Goals Addressed             This Visit's Progress    Exercise 3x per week (30 min per time)       She would like to get more active     Patient Stated   On track    02/26/2020 [facility] Goal: Keep All Scheduled Appointments  Over the next year, patient will attend all scheduled appointments with their PCP and any specialists that they see.      Quit Smoking              Patient Stated      02/26/2020 [facility] Goal: Keep All Scheduled Appointments  Over the next year, patient will attend all scheduled appointments with their PCP and any specialists that they see.       Quit Smoking      Weight (lb) < 155 lb (70.3 kg) (pt-stated)      Decrease portion sizes, and fried foods.         This is a list of the screening recommended for you and due dates:  Health Maintenance  Topic Date Due   COVID-19 Vaccine (3 - Moderna risk series) 05/20/2020   Mammogram  07/04/2022   DEXA scan (bone density measurement)  11/24/2022   Screening for Lung Cancer  01/17/2024*   Flu Shot  07/26/2023   Medicare Annual Wellness Visit  04/12/2024   DTaP/Tdap/Td vaccine (2 - Td or Tdap) 10/28/2024   Cologuard (Stool DNA test)  03/29/2025   Pneumonia Vaccine  Completed   Hepatitis C Screening: USPSTF Recommendation to screen - Ages 55-79 yo.  Completed   Zoster (Shingles) Vaccine  Completed   HPV Vaccine  Aged Out   Colon Cancer Screening  Discontinued  *Topic was postponed. The date shown is not the original due date.    Advanced directives: Advance directive discussed with you today. I have provided a copy for you to complete at home and have  notarized. Once this is complete please bring a copy in to our office so we can scan it into your chart.   Conditions/risks identified: Aim for 30 minutes of exercise or brisk walking, 6-8 glasses of water, and 5 servings of fruits and vegetables each day.   Next appointment: Follow up in one year for your annual wellness visit    Preventive Care 65 Years and Older, Female Preventive care refers to lifestyle choices and visits with your health care provider that can promote health and wellness. What does preventive care include? A yearly physical exam. This is also called an annual well check. Dental exams once or twice a year. Routine eye exams. Ask your health care provider how often you should have your eyes checked. Personal lifestyle choices, including: Daily care of your teeth and gums. Regular physical activity. Eating a healthy diet. Avoiding tobacco and drug use. Limiting alcohol use. Practicing safe sex. Taking low-dose aspirin every day. Taking vitamin and mineral supplements as recommended by your health care provider. What happens during an annual well  check? The services and screenings done by your health care provider during your annual well check will depend on your age, overall health, lifestyle risk factors, and family history of disease. Counseling  Your health care provider may ask you questions about your: Alcohol use. Tobacco use. Drug use. Emotional well-being. Home and relationship well-being. Sexual activity. Eating habits. History of falls. Memory and ability to understand (cognition). Work and work Astronomer. Reproductive health. Screening  You may have the following tests or measurements: Height, weight, and BMI. Blood pressure. Lipid and cholesterol levels. These may be checked every 5 years, or more frequently if you are over 54 years old. Skin check. Lung cancer screening. You may have this screening every year starting at age 46 if you have  a 30-pack-year history of smoking and currently smoke or have quit within the past 15 years. Fecal occult blood test (FOBT) of the stool. You may have this test every year starting at age 77. Flexible sigmoidoscopy or colonoscopy. You may have a sigmoidoscopy every 5 years or a colonoscopy every 10 years starting at age 35. Hepatitis C blood test. Hepatitis B blood test. Sexually transmitted disease (STD) testing. Diabetes screening. This is done by checking your blood sugar (glucose) after you have not eaten for a while (fasting). You may have this done every 1-3 years. Bone density scan. This is done to screen for osteoporosis. You may have this done starting at age 45. Mammogram. This may be done every 1-2 years. Talk to your health care provider about how often you should have regular mammograms. Talk with your health care provider about your test results, treatment options, and if necessary, the need for more tests. Vaccines  Your health care provider may recommend certain vaccines, such as: Influenza vaccine. This is recommended every year. Tetanus, diphtheria, and acellular pertussis (Tdap, Td) vaccine. You may need a Td booster every 10 years. Zoster vaccine. You may need this after age 64. Pneumococcal 13-valent conjugate (PCV13) vaccine. One dose is recommended after age 15. Pneumococcal polysaccharide (PPSV23) vaccine. One dose is recommended after age 47. Talk to your health care provider about which screenings and vaccines you need and how often you need them. This information is not intended to replace advice given to you by your health care provider. Make sure you discuss any questions you have with your health care provider. Document Released: 01/07/2016 Document Revised: 08/30/2016 Document Reviewed: 10/12/2015 Elsevier Interactive Patient Education  2017 [employer].  Fall Prevention in the Home Falls can cause injuries. They can happen to people of all ages. There are many  things you can do to make your home safe and to help prevent falls. What can I do on the outside of my home? Regularly fix the edges of walkways and driveways and fix any cracks. Remove anything that might make you trip as you walk through a door, such as a raised step or threshold. Trim any bushes or trees on the path to your home. Use bright outdoor lighting. Clear any walking paths of anything that might make someone trip, such as rocks or tools. Regularly check to see if handrails are loose or broken. Make sure that both sides of any steps have handrails. Any raised decks and porches should have guardrails on the edges. Have any leaves, snow, or ice cleared regularly. Use sand or salt on walking paths during winter. Clean up any spills in your garage right away. This includes oil or grease spills. What can I do in  the bathroom? Use night lights. Install grab bars by the toilet and in the tub and shower. Do not use towel bars as grab bars. Use non-skid mats or decals in the tub or shower. If you need to sit down in the shower, use a plastic, non-slip stool. Keep the floor dry. Clean up any water that spills on the floor as soon as it happens. Remove soap buildup in the tub or shower regularly. Attach bath mats securely with double-sided non-slip rug tape. Do not have throw rugs and other things on the floor that can make you trip. What can I do in the bedroom? Use night lights. Make sure that you have a light by your bed that is easy to reach. Do not use any sheets or blankets that are too big for your bed. They should not hang down onto the floor. Have a firm chair that has side arms. You can use this for support while you get dressed. Do not have throw rugs and other things on the floor that can make you trip. What can I do in the kitchen? Clean up any spills right away. Avoid walking on wet floors. Keep items that you use a lot in easy-to-reach places. If you need to reach  something above you, use a strong step stool that has a grab bar. Keep electrical cords out of the way. Do not use floor polish or wax that makes floors slippery. If you must use wax, use non-skid floor wax. Do not have throw rugs and other things on the floor that can make you trip. What can I do with my stairs? Do not leave any items on the stairs. Make sure that there are handrails on both sides of the stairs and use them. Fix handrails that are broken or loose. Make sure that handrails are as long as the stairways. Check any carpeting to make sure that it is firmly attached to the stairs. Fix any carpet that is loose or worn. Avoid having throw rugs at the top or bottom of the stairs. If you do have throw rugs, attach them to the floor with carpet tape. Make sure that you have a light switch at the top of the stairs and the bottom of the stairs. If you do not have them, ask someone to add them for you. What else can I do to help prevent falls? Wear shoes that: Do not have high heels. Have rubber bottoms. Are comfortable and fit you well. Are closed at the toe. Do not wear sandals. If you use a stepladder: Make sure that it is fully opened. Do not climb a closed stepladder. Make sure that both sides of the stepladder are locked into place. Ask someone to hold it for you, if possible. Clearly mark and make sure that you can see: Any grab bars or handrails. First and last steps. Where the edge of each step is. Use tools that help you move around (mobility aids) if they are needed. These include: Canes. Walkers. Scooters. Crutches. Turn on the lights when you go into a dark area. Replace any light bulbs as soon as they burn out. Set up your furniture so you have a clear path. Avoid moving your furniture around. If any of your floors are uneven, fix them. If there are any pets around you, be aware of where they are. Review your medicines with your doctor. Some medicines can make you  feel dizzy. This can increase your chance of falling. Ask your doctor  what other things that you can do to help prevent falls. This information is not intended to replace advice given to you by your health care provider. Make sure you discuss any questions you have with your health care provider. Document Released: 10/07/2009 Document Revised: 05/18/2016 Document Reviewed: 01/15/2015 Elsevier Interactive Patient Education  2017 [employer].

## 2023-04-13 NOTE — Progress Notes (Signed)
Subjective:   Deetya Drouillard is a 72 y.o. female who presents for Medicare Annual (Subsequent) preventive examination. I connected with  Matti Killingsworth on 04/13/23 by a audio enabled telemedicine application and verified that I am speaking with the correct person using two identifiers.  Patient Location: Home  Provider Location: Home Office  I discussed the limitations of evaluation and management by telemedicine. The patient expressed understanding and agreed to proceed.  Review of Systems     Cardiac Risk Factors include: advanced age (>81men, >15 women);hypertension;smoking/ tobacco exposure;dyslipidemia     Objective:    Today's Vitals   04/13/23 1444  Weight: 143 lb (64.9 kg)  Height:  (1.651 m)   Body mass index is 23.8 kg/m.     04/13/2023    2:47 PM 02/06/2023   12:12 PM 01/30/2023   10:20 AM 04/07/2022    3:04 PM 04/06/2021    3:51 PM 02/26/2020    3:52 PM 02/25/2019    2:26 PM  Advanced Directives  Does Patient Have a Medical Advance Directive? No No No No No No No  Would patient like information on creating a medical advance directive? No - Patient declined No - Patient declined No - Patient declined No - Patient declined Yes (MAU/Ambulatory/Procedural Areas - Information given) No - Patient declined Yes (MAU/Ambulatory/Procedural Areas - Information given)    Current Medications (verified) Outpatient Encounter Medications as of 04/13/2023  Medication Sig   albuterol (VENTOLIN HFA) 108 (90 Base) MCG/ACT inhaler INHALE 1 TO 2 PUFFS EVERY 6 HOURS AS NEEDED FOR WHEEZING OR SHORTNESS OF BREATH   atorvastatin (LIPITOR) 20 MG tablet Take 1 tablet (20 mg total) by mouth daily.   Calcium Carbonate-Vitamin D (CALCIUM 600+D) 600-400 MG-UNIT tablet Take 1 tablet by mouth daily.   lisinopril (ZESTRIL) 10 MG tablet Take 1 tablet (10 mg total) by mouth daily.   mometasone (NASONEX) 50 MCG/ACT nasal spray Place 2 sprays into the nose daily.   ondansetron (ZOFRAN) 4 MG tablet Take 1  tablet (4 mg total) by mouth every 8 (eight) hours as needed for up to 20 doses for nausea or vomiting.   Probiotic Product (PROBIOTIC-10 PO) Take by mouth.   SPIRIVA RESPIMAT 2.5 MCG/ACT AERS INHALE 2 PUFFS INTO LUNGS DAILY   traMADol (ULTRAM) 50 MG tablet Take 1 tablet (50 mg total) by mouth every 8 (eight) hours as needed for up to 15 doses for moderate pain.   No facility-administered encounter medications on file as of 04/13/2023.    Allergies (verified) Doxycycline   History: Past Medical History:  Diagnosis Date   Cataract    HLD (hyperlipidemia)    Hypertension    PONV (postoperative nausea and vomiting)    Past Surgical History:  Procedure Laterality Date   BREAST LUMPECTOMY Left 90's   PRIMARY CLOSURE Left 02/06/2023   Procedure: Closure of Mohs defect, left chin, complex closure, local tissue rearrangement, rotational flap;  Surgeon: Santiago Glad, MD;  [facility]  [facility];  Service: Plastics;  Laterality: Left;   TUBAL LIGATION     Family History  Problem Relation Age of Onset   Heart disease Mother    Osteoporosis Mother    Hypertension Mother    Cancer Father        lung   Diabetes Father    Cancer Sister        brain   Diabetes Sister    Cancer Brother        brain  Osteoporosis Sister    Cancer Sister        breast   Diabetes Brother    Diabetes Brother    Social History   Socioeconomic History   Marital status: Married    Spouse name: [name]   Number of children: 2   Years of education: Not on file   Highest education level: Bachelor's degree (e.g., BA, AB, BS)  Occupational History   Occupation: Retired    Associate Professor: [employer]    Comment: [employer]  Tobacco Use   Smoking status: Heavy Smoker    Packs/day: 1.50    Years: 46.00    Additional pack years: 0.00    Total pack years: 69.00    Types: Cigarettes   Smokeless tobacco: Never  Vaping Use   Vaping Use: Never used  Substance and Sexual Activity    Alcohol use: Yes    Comment: occasional   Drug use: No   Sexual activity: Not Currently  Other Topics Concern   Not on file  Social History Narrative   Zosia is retired and lives with husband [name]. They have 2 children that they see regularly as well as grandchildren. She likes to read and spend time with the grandchildren. They have a dog that she walks multiple times per day for exercise.    Social Determinants of Health   Financial Resource Strain: Low Risk  (04/13/2023)   Overall Financial Resource Strain (CARDIA)    Difficulty of Paying Living Expenses: Not hard at all  Food Insecurity: No Food Insecurity (04/13/2023)   Hunger Vital Sign    Worried About Running Out of Food in the Last Year: Never true    Ran Out of Food in the Last Year: Never true  Transportation Needs: No Transportation Needs (04/07/2022)   PRAPARE - Administrator, Civil Service (Medical): No    Lack of Transportation (Non-Medical): No  Physical Activity: Insufficiently Active (04/13/2023)   Exercise Vital Sign    Days of Exercise per Week: 3 days    Minutes of Exercise per Session: 30 min  Stress: No Stress Concern Present (04/13/2023)   [employer] of Occupational Health - Occupational Stress Questionnaire    Feeling of Stress : Not at all  Social Connections: Moderately Integrated (04/13/2023)   Social Connection and Isolation Panel [NHANES]    Frequency of Communication with Friends and Family: More than three times a week    Frequency of Social Gatherings with Friends and Family: More than three times a week    Attends Religious Services: More than 4 times per year    Active Member of [employer] or Organizations: No    Attends Engineer, structural: Never    Marital Status: Married    Tobacco Counseling Ready to quit: No Counseling given: Not Answered   Clinical Intake:  Pre-visit preparation completed: Yes  Pain : No/denies pain     Nutritional Risks: None Diabetes:  No  How often do you need to have someone help you when you read instructions, pamphlets, or other written materials from your doctor or pharmacy?: 1 - Never  Diabetic?no   Interpreter Needed?: No  Information entered by :: Renie Ora, LPN   Activities of Daily Living    04/13/2023    2:47 PM 02/06/2023   12:18 PM  In your present state of health, do you have any difficulty performing the following activities:  Hearing? 0 0  Vision? 0 0  Difficulty concentrating or  making decisions? 0 0  Walking or climbing stairs? 0 0  Dressing or bathing? 0 0  Doing errands, shopping? 0   Preparing Food and eating ? N   Using the Toilet? N   In the past six months, have you accidently leaked urine? N   Do you have problems with loss of bowel control? N   Managing your Medications? N   Managing your Finances? N   Housekeeping or managing your Housekeeping? N     Patient Care Team: Junie Spencer, FNP as PCP - General (Family Medicine) Desiree Lucy, OD (Optometry)  Indicate any recent Medical Services you may have received from other than [facility] providers in the past year (date may be approximate).     Assessment:   This is a routine wellness examination for Graceann.  Hearing/Vision screen Vision Screening - Comments:: Wears rx glasses - up to date with routine eye exams with  Dr.Davis   Dietary issues and exercise activities discussed: Current Exercise Habits: Home exercise routine, Type of exercise: walking, Time (Minutes): 30, Frequency (Times/Week): 3, Weekly Exercise (Minutes/Week): 90, Intensity: Mild, Exercise limited by: None identified   Goals Addressed             This Visit's Progress    Exercise 3x per week (30 min per time)   On track    She would like to get more active Goals Addressed             This Visit's Progress    Exercise 3x per week (30 min per time)       She would like to get more active     Patient Stated   On track    02/26/2020 AWV Goal:  Keep All Scheduled Appointments  Over the next year, patient will attend all scheduled appointments with their PCP and any specialists that they see.      Quit Smoking               Depression Screen    04/13/2023    2:46 PM 01/16/2023   11:22 AM 04/07/2022    3:00 PM 03/07/2022    8:58 AM 09/12/2021   10:53 AM 08/11/2021   10:21 AM 04/06/2021    3:48 PM  PHQ 2/9 Scores  PHQ - 2 Score 0 0 0 0 0 0 0  PHQ- 9 Score  0 0 0 0      Fall Risk    04/13/2023    2:44 PM 01/16/2023   11:22 AM 04/07/2022    3:04 PM 08/11/2021   10:21 AM 04/06/2021    3:56 PM  Fall Risk   Falls in the past year? 0 0 0 0 0  Number falls in past yr: 0  0  0  Injury with Fall? 0  0  0  Risk for fall due to : No Fall Risks  No Fall Risks    Follow up Falls prevention discussed  Falls prevention discussed  Falls prevention discussed    FALL RISK PREVENTION PERTAINING TO THE HOME:  Any stairs in or around the home? No  If so, are there any without handrails? No  Home free of loose throw rugs in walkways, pet beds, electrical cords, etc? Yes  Adequate lighting in your home to reduce risk of falls? Yes   ASSISTIVE DEVICES UTILIZED TO PREVENT FALLS:  Life alert? No  Use of a cane, walker or w/c? No  Grab bars in the bathroom? No  Shower  chair or bench in shower? Yes  Elevated toilet seat or a handicapped toilet? No       02/25/2019    2:33 PM  MMSE - Mini Mental State Exam  Orientation to time 5  Orientation to Place 5  Registration 3  Attention/ Calculation 5  Recall 3  Language- name 2 objects 2  Language- repeat 1  Language- follow 3 step command 3  Language- read & follow direction 1  Write a sentence 1  Copy design 1  Total score 30        04/13/2023    2:47 PM 04/07/2022    3:08 PM 02/26/2020    3:55 PM  6CIT Screen  What Year? 0 points 0 points 0 points  What month? 0 points 0 points 0 points  What time? 0 points 0 points 0 points  Count back from 20 0 points 0 points 0 points   Months in reverse 0 points 0 points 0 points  Repeat phrase 0 points 0 points 0 points  Total Score 0 points 0 points 0 points    Immunizations Immunization History  Administered Date(s) Administered   Fluad Quad(high Dose 65+) 11/23/2020, 09/12/2021   Influenza Split 09/24/2013   Influenza, High Dose Seasonal PF 09/23/2016, 02/20/2018   Influenza,inj,Quad PF,6+ Mos 10/28/2014, 12/03/2015   Influenza,inj,quad, With Preservative 01/06/2019   Moderna Sars-Covid-2 Vaccination 03/25/2020, 04/22/2020   Pneumococcal Conjugate-13 12/03/2015   Pneumococcal Polysaccharide-23 02/20/2018   Tdap 10/28/2014   Zoster Recombinat (Shingrix) 03/07/2022, 01/16/2023    TDAP status: Up to date  Flu Vaccine status: Declined, Education has been provided regarding the importance of this vaccine but patient still declined. Advised may receive this vaccine at local pharmacy or Health Dept. Aware to provide a copy of the vaccination record if obtained from local pharmacy or Health Dept. Verbalized acceptance and understanding.  Pneumococcal vaccine status: Up to date  Covid-19 vaccine status: Completed vaccines  Qualifies for Shingles Vaccine? Yes   Zostavax completed Yes   Shingrix Completed?: Yes  Screening Tests Health Maintenance  Topic Date Due   COVID-19 Vaccine (3 - Moderna risk series) 05/20/2020   MAMMOGRAM  07/04/2022   DEXA SCAN  11/24/2022   Lung Cancer Screening  01/17/2024 (Originally 01/03/2000)   INFLUENZA VACCINE  07/26/2023   Medicare Annual Wellness (AWV)  04/12/2024   DTaP/Tdap/Td (2 - Td or Tdap) 10/28/2024   Fecal DNA (Cologuard)  03/29/2025   Pneumonia Vaccine 39+ Years old  Completed   Hepatitis C Screening  Completed   Zoster Vaccines- Shingrix  Completed   HPV VACCINES  Aged Out   COLONOSCOPY (Pts 45-60yrs Insurance coverage will need to be confirmed)  Discontinued    Health Maintenance  Health Maintenance Due  Topic Date Due   COVID-19 Vaccine (3 - Moderna risk  series) 05/20/2020   MAMMOGRAM  07/04/2022   DEXA SCAN  11/24/2022    Colorectal cancer screening: Type of screening: Cologuard. Completed 03/29/2022. Repeat every 3 years  Mammogram status: Ordered 04/13/2023. Pt provided with contact info and advised to call to schedule appt.   Bone Density status: Ordered 04/13/2023. Pt provided with contact info and advised to call to schedule appt.  Lung Cancer Screening: (Low Dose CT Chest recommended if Age 29-80 years, 30 pack-year currently smoking OR have quit w/in 15years.) does qualify.   Lung Cancer Screening Referral: Will dis cuss with PCP   Additional Screening:  Hepatitis C Screening: does not qualify; Completed 05/16/2016  Vision Screening: Recommended annual  ophthalmology exams for early detection of glaucoma and other disorders of the eye. Is the patient up to date with their annual eye exam?  Yes  Who is the provider or what is the name of the office in which the patient attends annual eye exams? Dr.Davis  If pt is not established with a provider, would they like to be referred to a provider to establish care? No .   Dental Screening: Recommended annual dental exams for proper oral hygiene  Community Resource Referral / Chronic Care Management: CRR required this visit?  No   CCM required this visit?  No      Plan:     I have personally reviewed and noted the following in the patient's chart:   Medical and social history Use of alcohol, tobacco or illicit drugs  Current medications and supplements including opioid prescriptions. Patient is not currently taking opioid prescriptions. Functional ability and status Nutritional status Physical activity Advanced directives List of other physicians Hospitalizations, surgeries, and ER visits in previous 12 months Vitals Screenings to include cognitive, depression, and falls Referrals and appointments  In addition, I have reviewed and discussed with patient certain  preventive protocols, quality metrics, and best practice recommendations. A written personalized care plan for preventive services as well as general preventive health recommendations were provided to patient.     Lorrene Reid, LPN   04/02/8118   Nurse Notes: will discuss Lung Cancer screening with PCP

## 2023-04-16 ENCOUNTER — Ambulatory Visit (INDEPENDENT_AMBULATORY_CARE_PROVIDER_SITE_OTHER): Payer: Medicare Other | Admitting: Plastic Surgery

## 2023-04-16 DIAGNOSIS — Z9889 Other specified postprocedural states: Secondary | ICD-10-CM

## 2023-04-16 NOTE — Progress Notes (Signed)
Brianna [name] returns today after repair of Mohs defect on February 13.  She is doing well and all incisions are completely healed at this point.  She does have a small standing cone defect on the right chin.  She also reports numbness on the chin.  On examination all incisions are well-healed.  It is noted there is a small standing cone defect on the chin.  She will continue massage of the scars and continue sunscreen.  She will follow-up with me in 6 months and we will plan for a revision of the scar as necessary at approximately 1 year postop.

## 2023-04-17 ENCOUNTER — Ambulatory Visit (HOSPITAL_BASED_OUTPATIENT_CLINIC_OR_DEPARTMENT_OTHER)
Admission: RE | Admit: 2023-04-17 | Discharge: 2023-04-17 | Disposition: A | Discharge status: Home / Self Care | Payer: Medicare Other | Source: Ambulatory Visit | Attending: Family | Admitting: Family

## 2023-04-17 ENCOUNTER — Encounter (HOSPITAL_BASED_OUTPATIENT_CLINIC_OR_DEPARTMENT_OTHER): Payer: Self-pay

## 2023-04-17 DIAGNOSIS — J439 Emphysema, unspecified: Secondary | ICD-10-CM | POA: Insufficient documentation

## 2023-04-17 DIAGNOSIS — Z122 Encounter for screening for malignant neoplasm of respiratory organs: Secondary | ICD-10-CM | POA: Diagnosis not present

## 2023-04-17 DIAGNOSIS — F17210 Nicotine dependence, cigarettes, uncomplicated: Secondary | ICD-10-CM | POA: Diagnosis not present

## 2023-04-17 DIAGNOSIS — J449 Chronic obstructive pulmonary disease, unspecified: Secondary | ICD-10-CM | POA: Insufficient documentation

## 2023-04-17 DIAGNOSIS — I2510 Atherosclerotic heart disease of native coronary artery without angina pectoris: Secondary | ICD-10-CM | POA: Diagnosis not present

## 2023-04-17 DIAGNOSIS — F17200 Nicotine dependence, unspecified, uncomplicated: Secondary | ICD-10-CM | POA: Diagnosis not present

## 2023-04-17 DIAGNOSIS — R911 Solitary pulmonary nodule: Secondary | ICD-10-CM | POA: Insufficient documentation

## 2023-04-17 DIAGNOSIS — I700 Atherosclerosis of aorta: Secondary | ICD-10-CM | POA: Insufficient documentation

## 2023-04-19 ENCOUNTER — Other Ambulatory Visit: Payer: Self-pay | Admitting: Family

## 2023-04-19 DIAGNOSIS — I700 Atherosclerosis of aorta: Secondary | ICD-10-CM

## 2023-04-20 NOTE — Progress Notes (Signed)
Patient r/ c °

## 2023-05-23 ENCOUNTER — Ambulatory Visit (INDEPENDENT_AMBULATORY_CARE_PROVIDER_SITE_OTHER): Payer: Medicare Other

## 2023-05-23 ENCOUNTER — Ambulatory Visit
Admission: RE | Admit: 2023-05-23 | Discharge: 2023-05-23 | Disposition: A | Discharge status: Home / Self Care | Payer: Medicare Other | Source: Ambulatory Visit | Attending: Family | Admitting: Family

## 2023-05-23 DIAGNOSIS — Z780 Asymptomatic menopausal state: Secondary | ICD-10-CM

## 2023-05-23 DIAGNOSIS — Z1231 Encounter for screening mammogram for malignant neoplasm of breast: Secondary | ICD-10-CM

## 2023-05-24 DIAGNOSIS — M8589 Other specified disorders of bone density and structure, multiple sites: Secondary | ICD-10-CM | POA: Diagnosis not present

## 2023-05-24 DIAGNOSIS — Z780 Asymptomatic menopausal state: Secondary | ICD-10-CM | POA: Diagnosis not present

## 2023-05-25 ENCOUNTER — Other Ambulatory Visit: Payer: Self-pay | Admitting: Family

## 2023-05-28 ENCOUNTER — Other Ambulatory Visit: Payer: Self-pay | Admitting: Family

## 2023-05-28 DIAGNOSIS — J449 Chronic obstructive pulmonary disease, unspecified: Secondary | ICD-10-CM

## 2023-07-17 ENCOUNTER — Encounter: Payer: Self-pay | Admitting: Family

## 2023-07-17 ENCOUNTER — Other Ambulatory Visit: Payer: Self-pay | Admitting: Family

## 2023-07-17 ENCOUNTER — Ambulatory Visit: Payer: Medicare Other | Admitting: Family

## 2023-07-17 VITALS — BP 126/59 | HR 62 | Temp 97.5°F | Ht 65.0 in | Wt 137.4 lb

## 2023-07-17 DIAGNOSIS — E663 Overweight: Secondary | ICD-10-CM | POA: Diagnosis not present

## 2023-07-17 DIAGNOSIS — I700 Atherosclerosis of aorta: Secondary | ICD-10-CM

## 2023-07-17 DIAGNOSIS — I10 Essential (primary) hypertension: Secondary | ICD-10-CM

## 2023-07-17 DIAGNOSIS — E785 Hyperlipidemia, unspecified: Secondary | ICD-10-CM | POA: Diagnosis not present

## 2023-07-17 DIAGNOSIS — J449 Chronic obstructive pulmonary disease, unspecified: Secondary | ICD-10-CM | POA: Diagnosis not present

## 2023-07-17 DIAGNOSIS — Z6822 Body mass index (BMI) 22.0-22.9, adult: Secondary | ICD-10-CM | POA: Diagnosis not present

## 2023-07-17 DIAGNOSIS — F17200 Nicotine dependence, unspecified, uncomplicated: Secondary | ICD-10-CM | POA: Diagnosis not present

## 2023-07-17 DIAGNOSIS — M8580 Other specified disorders of bone density and structure, unspecified site: Secondary | ICD-10-CM

## 2023-07-17 LAB — CBC WITH DIFFERENTIAL/PLATELET
Eos: 3 %
Immature Granulocytes: 0 %
Lymphocytes Absolute: 3.3 10*3/uL — ABNORMAL HIGH (ref 0.7–3.1)
Lymphs: 40 %
MCV: 90 fL (ref 79–97)
Monocytes Absolute: 0.8 10*3/uL (ref 0.1–0.9)
Monocytes: 9 %
RBC: 4.88 x10E6/uL (ref 3.77–5.28)

## 2023-07-17 LAB — CMP14+EGFR
Chloride: 100 mmol/L (ref 96–106)
Potassium: 4.7 mmol/L (ref 3.5–5.2)
Sodium: 139 mmol/L (ref 134–144)

## 2023-07-17 NOTE — Progress Notes (Signed)
Subjective:    Patient ID: Brianna [name], female    DOB: [birth year], 72 y.o.   MRN: [id number]  Chief Complaint  Patient presents with   Medical Management of Chronic Issues   Pt presents to the office  today for  chronic follow up.    She had Moh surgery on 02/24 and followed by plastic surgery.    She has COPD and she had decreased to 1/2  pack a day. States she has intermittent SOB with exertion. She uses Spiriva daily and albuterol as needed.    She osteopenia and takes calcium and vit d daily. She walks daily for 30 mins a day. Last Dexa scan 05/25/23.   Pt has Aortic atherosclerosis and takes Lipitor daily.  Hypertension This is a chronic problem. The current episode started more than 1 year ago. The problem has been resolved since onset. The problem is controlled. Associated symptoms include malaise/fatigue. Pertinent negatives include no peripheral edema or shortness of breath. Risk factors for coronary artery disease include dyslipidemia, sedentary lifestyle and smoking/tobacco exposure. The current treatment provides moderate improvement.  Hyperlipidemia This is a chronic problem. The current episode started more than 1 year ago. The problem is controlled. Recent lipid tests were reviewed and are normal. Pertinent negatives include no shortness of breath. Current antihyperlipidemic treatment includes statins. The current treatment provides moderate improvement of lipids. Risk factors for coronary artery disease include dyslipidemia, hypertension, a sedentary lifestyle and post-menopausal.  Nicotine Dependence Presents for follow-up visit. Her urge triggers include company of smokers. She smokes < 1/2 a pack of cigarettes per day.      Review of Systems  Constitutional:  Positive for malaise/fatigue.  Respiratory:  Negative for shortness of breath.   All other systems reviewed and are negative.      Objective:   Physical Exam Vitals reviewed.  Constitutional:      General:  She is not in acute distress.    Appearance: She is well-developed.  HENT:     Head: Normocephalic and atraumatic.     Right Ear: Tympanic membrane normal.     Left Ear: Tympanic membrane normal.  Eyes:     Pupils: Pupils are equal, round, and reactive to light.  Neck:     Thyroid: No thyromegaly.  Cardiovascular:     Rate and Rhythm: Normal rate and regular rhythm.     Heart sounds: Normal heart sounds. No murmur heard. Pulmonary:     Effort: Pulmonary effort is normal. No respiratory distress.     Breath sounds: Rhonchi present. No wheezing.  Abdominal:     General: Bowel sounds are normal. There is no distension.     Palpations: Abdomen is soft.     Tenderness: There is no abdominal tenderness.  Musculoskeletal:        General: No tenderness. Normal range of motion.     Cervical back: Normal range of motion and neck supple.  Skin:    General: Skin is warm and dry.  Neurological:     Mental Status: She is alert and oriented to person, place, and time.     Cranial Nerves: No cranial nerve deficit.     Deep Tendon Reflexes: Reflexes are normal and symmetric.  Psychiatric:        Behavior: Behavior normal.        Thought Content: Thought content normal.        Judgment: Judgment normal.        BP (!) 126/59   Pulse  62   Temp (!) 97.5 F (36.4 C) (Temporal)   Ht 5\' 5"  (1.651 m)   Wt 137 lb 6.4 oz (62.3 kg)   SpO2 98%   BMI 22.86 kg/m   Assessment & Plan:  Brianna [name] comes in today with chief complaint of Medical Management of Chronic Issues   Diagnosis and orders addressed:  1. Chronic obstructive pulmonary disease, unspecified COPD type (HCC) - CMP14+EGFR - CBC with Differential/Platelet  2. Hypertension, unspecified type - CMP14+EGFR - CBC with Differential/Platelet  3. Hyperlipidemia, unspecified hyperlipidemia type - CMP14+EGFR - CBC with Differential/Platelet  4. Overweight (BMI 25.0-29.9) - CMP14+EGFR - CBC with Differential/Platelet  5.  Tobacco use disorder - CMP14+EGFR - CBC with Differential/Platelet  6. Aortic atherosclerosis (HCC) - CMP14+EGFR - CBC with Differential/Platelet  7. Osteopenia with high risk of fracture - CMP14+EGFR - CBC with Differential/Platelet   Labs pending Health Maintenance reviewed Diet and exercise encouraged  Follow up plan: 6 months    Jannifer Rodney, FNP

## 2023-07-17 NOTE — Patient Instructions (Signed)
Health Maintenance After Age 73 After age 73, you are at a higher risk for certain long-term diseases and infections as well as injuries from falls. Falls are a major cause of broken bones and head injuries in people who are older than age 73. Getting regular preventive care can help to keep you healthy and well. Preventive care includes getting regular testing and making lifestyle changes as recommended by your health care provider. Talk with your health care provider about: Which screenings and tests you should have. A screening is a test that checks for a disease when you have no symptoms. A diet and exercise plan that is right for you. What should I know about screenings and tests to prevent falls? Screening and testing are the best ways to find a health problem early. Early diagnosis and treatment give you the best chance of managing medical conditions that are common after age 73. Certain conditions and lifestyle choices may make you more likely to have a fall. Your health care provider may recommend: Regular vision checks. Poor vision and conditions such as cataracts can make you more likely to have a fall. If you wear glasses, make sure to get your prescription updated if your vision changes. Medicine review. Work with your health care provider to regularly review all of the medicines you are taking, including over-the-counter medicines. Ask your health care provider about any side effects that may make you more likely to have a fall. Tell your health care provider if any medicines that you take make you feel dizzy or sleepy. Strength and balance checks. Your health care provider may recommend certain tests to check your strength and balance while standing, walking, or changing positions. Foot health exam. Foot pain and numbness, as well as not wearing proper footwear, can make you more likely to have a fall. Screenings, including: Osteoporosis screening. Osteoporosis is a condition that causes  the bones to get weaker and break more easily. Blood pressure screening. Blood pressure changes and medicines to control blood pressure can make you feel dizzy. Depression screening. You may be more likely to have a fall if you have a fear of falling, feel depressed, or feel unable to do activities that you used to do. Alcohol use screening. Using too much alcohol can affect your balance and may make you more likely to have a fall. Follow these instructions at home: Lifestyle Do not drink alcohol if: Your health care provider tells you not to drink. If you drink alcohol: Limit how much you have to: 0-1 drink a day for women. 0-2 drinks a day for men. Know how much alcohol is in your drink. In the [place] one drink equals one 12 oz bottle of beer (355 mL), one 5 oz glass of wine (148 mL), or one 1 oz glass of hard liquor (44 mL). Do not use any products that contain nicotine or tobacco. These products include cigarettes, chewing tobacco, and vaping devices, such as e-cigarettes. If you need help quitting, ask your health care provider. Activity  Follow a regular exercise program to stay fit. This will help you maintain your balance. Ask your health care provider what types of exercise are appropriate for you. If you need a cane or walker, use it as recommended by your health care provider. Wear supportive shoes that have nonskid soles. Safety  Remove any tripping hazards, such as rugs, cords, and clutter. Install safety equipment such as grab bars in bathrooms and safety rails on stairs. Keep rooms and walkways   well-lit. General instructions Talk with your health care provider about your risks for falling. Tell your health care provider if: You fall. Be sure to tell your health care provider about all falls, even ones that seem minor. You feel dizzy, tiredness (fatigue), or off-balance. Take over-the-counter and prescription medicines only as told by your health care provider. These include  supplements. Eat a healthy diet and maintain a healthy weight. A healthy diet includes low-fat dairy products, low-fat (lean) meats, and fiber from whole grains, beans, and lots of fruits and vegetables. Stay current with your vaccines. Schedule regular health, dental, and eye exams. Summary Having a healthy lifestyle and getting preventive care can help to protect your health and wellness after age 73. Screening and testing are the best way to find a health problem early and help you avoid having a fall. Early diagnosis and treatment give you the best chance for managing medical conditions that are more common for people who are older than age 73. Falls are a major cause of broken bones and head injuries in people who are older than age 73. Take precautions to prevent a fall at home. Work with your health care provider to learn what changes you can make to improve your health and wellness and to prevent falls. This information is not intended to replace advice given to you by your health care provider. Make sure you discuss any questions you have with your health care provider. Document Revised: 05/02/2021 Document Reviewed: 05/02/2021 Elsevier Patient Education  2024 [employer]  

## 2023-07-18 LAB — CMP14+EGFR
Albumin: 4.4 g/dL (ref 3.8–4.8)
BUN/Creatinine Ratio: 18 (ref 12–28)
BUN: 12 mg/dL (ref 8–27)
Bilirubin Total: 0.6 mg/dL (ref 0.0–1.2)
Calcium: 10.2 mg/dL (ref 8.7–10.3)
Glucose: 75 mg/dL (ref 70–99)

## 2023-07-18 LAB — CBC WITH DIFFERENTIAL/PLATELET
Basophils Absolute: 0.1 10*3/uL (ref 0.0–0.2)
Basos: 1 %
EOS (ABSOLUTE): 0.2 10*3/uL (ref 0.0–0.4)
Hematocrit: 43.8 % (ref 34.0–46.6)
Hemoglobin: 14.6 g/dL (ref 11.1–15.9)
Immature Grans (Abs): 0 10*3/uL (ref 0.0–0.1)
MCH: 29.9 pg (ref 26.6–33.0)
MCHC: 33.3 g/dL (ref 31.5–35.7)
Neutrophils Absolute: 3.9 10*3/uL (ref 1.4–7.0)
Neutrophils: 47 %
Platelets: 313 10*3/uL (ref 150–450)
RDW: 12.7 % (ref 11.7–15.4)
WBC: 8.3 10*3/uL (ref 3.4–10.8)

## 2023-08-15 ENCOUNTER — Other Ambulatory Visit: Payer: Self-pay | Admitting: Family

## 2023-08-15 DIAGNOSIS — E785 Hyperlipidemia, unspecified: Secondary | ICD-10-CM

## 2023-09-17 ENCOUNTER — Other Ambulatory Visit: Payer: Self-pay | Admitting: Family

## 2023-09-17 DIAGNOSIS — I10 Essential (primary) hypertension: Secondary | ICD-10-CM

## 2023-09-24 ENCOUNTER — Ambulatory Visit: Payer: Medicare Other | Admitting: Plastic Surgery

## 2023-09-24 ENCOUNTER — Encounter: Payer: Self-pay | Admitting: Plastic Surgery

## 2023-09-24 VITALS — BP 135/72 | HR 70 | Ht 65.0 in | Wt 137.0 lb

## 2023-09-24 DIAGNOSIS — R200 Anesthesia of skin: Secondary | ICD-10-CM | POA: Diagnosis not present

## 2023-09-24 DIAGNOSIS — Z85828 Personal history of other malignant neoplasm of skin: Secondary | ICD-10-CM

## 2023-09-24 DIAGNOSIS — Z9889 Other specified postprocedural states: Secondary | ICD-10-CM

## 2023-09-24 NOTE — Progress Notes (Signed)
Brianna [name] returns today for evaluation after undergoing reconstruction for a Mohs defect on her chin in February 2024.  She is doing well.  Her incisions have healed nicely.  She notes that she still has occasional dribbling from her lip which is due to the numbness that she is experiencing.  She does note that this numbness has improved since surgery.  There is no functional defect with her lips in repose.  When she smiles there is a very small downward depression on the left side of the lip.  Status post Mohs reconstruction, chin: She is overall satisfied with her results.  There is some depression of the scar in the central portion of the repair but she is not interested in any revision of the scar at this time.  She may follow-up as needed.  She may also return for revision of her scar at any point.

## 2023-10-15 ENCOUNTER — Other Ambulatory Visit: Payer: Self-pay | Admitting: Family

## 2023-10-15 DIAGNOSIS — J449 Chronic obstructive pulmonary disease, unspecified: Secondary | ICD-10-CM

## 2024-01-09 ENCOUNTER — Other Ambulatory Visit: Payer: Self-pay | Admitting: Family

## 2024-01-09 DIAGNOSIS — J449 Chronic obstructive pulmonary disease, unspecified: Secondary | ICD-10-CM

## 2024-01-17 ENCOUNTER — Encounter: Payer: Self-pay | Admitting: Family

## 2024-01-17 ENCOUNTER — Ambulatory Visit: Payer: Medicare Other | Admitting: Family

## 2024-01-17 VITALS — BP 113/58 | HR 68 | Temp 97.0°F | Ht 65.0 in | Wt 132.0 lb

## 2024-01-17 DIAGNOSIS — Z23 Encounter for immunization: Secondary | ICD-10-CM | POA: Diagnosis not present

## 2024-01-17 DIAGNOSIS — M8580 Other specified disorders of bone density and structure, unspecified site: Secondary | ICD-10-CM | POA: Diagnosis not present

## 2024-01-17 DIAGNOSIS — E663 Overweight: Secondary | ICD-10-CM

## 2024-01-17 DIAGNOSIS — I10 Essential (primary) hypertension: Secondary | ICD-10-CM

## 2024-01-17 DIAGNOSIS — I700 Atherosclerosis of aorta: Secondary | ICD-10-CM

## 2024-01-17 DIAGNOSIS — E785 Hyperlipidemia, unspecified: Secondary | ICD-10-CM | POA: Diagnosis not present

## 2024-01-17 DIAGNOSIS — J449 Chronic obstructive pulmonary disease, unspecified: Secondary | ICD-10-CM

## 2024-01-17 DIAGNOSIS — F17200 Nicotine dependence, unspecified, uncomplicated: Secondary | ICD-10-CM | POA: Diagnosis not present

## 2024-01-17 DIAGNOSIS — Z0001 Encounter for general adult medical examination with abnormal findings: Secondary | ICD-10-CM

## 2024-01-17 DIAGNOSIS — Z0000 Encounter for general adult medical examination without abnormal findings: Secondary | ICD-10-CM | POA: Diagnosis not present

## 2024-01-17 NOTE — Patient Instructions (Signed)

## 2024-01-17 NOTE — Progress Notes (Signed)
Subjective:    Patient ID: Brianna [name], female    DOB: 1951/12/11, 73 y.o.   MRN: [id number]  Chief Complaint  Patient presents with   Medical Management of Chronic Issues    Patient wants flu shot today. Spiriva cost too much    Pt presents to the office today for CPE and chronic follow up.    She had Moh surgery on 02/24 and followed by plastic surgery.    She has COPD and she had decreased to 1/2  pack a day. States she has intermittent SOB with exertion. She uses Spiriva daily and albuterol as needed.    She osteopenia and takes calcium and vit d daily. She walks daily for 30 mins a day. Last Dexa scan 05/25/23.   Pt has Aortic atherosclerosis and takes Lipitor daily.  Hypertension This is a chronic problem. The current episode started more than 1 year ago. The problem has been resolved since onset. The problem is controlled. Associated symptoms include malaise/fatigue. Pertinent negatives include no peripheral edema or shortness of breath. Risk factors for coronary artery disease include dyslipidemia, sedentary lifestyle and smoking/tobacco exposure. The current treatment provides moderate improvement.  Hyperlipidemia This is a chronic problem. The current episode started more than 1 year ago. The problem is controlled. Recent lipid tests were reviewed and are normal. Pertinent negatives include no shortness of breath. Current antihyperlipidemic treatment includes statins. The current treatment provides moderate improvement of lipids. Risk factors for coronary artery disease include dyslipidemia, hypertension, a sedentary lifestyle and post-menopausal.  Nicotine Dependence Presents for follow-up visit. She smokes < 1/2 a pack of cigarettes per day.      Review of Systems  Constitutional:  Positive for malaise/fatigue.  Respiratory:  Negative for shortness of breath.   All other systems reviewed and are negative.      Objective:   Physical Exam Vitals reviewed.   Constitutional:      General: She is not in acute distress.    Appearance: She is well-developed.  HENT:     Head: Normocephalic and atraumatic.     Right Ear: Tympanic membrane normal.     Left Ear: Tympanic membrane normal.  Eyes:     Pupils: Pupils are equal, round, and reactive to light.  Neck:     Thyroid: No thyromegaly.  Cardiovascular:     Rate and Rhythm: Normal rate and regular rhythm.     Heart sounds: Normal heart sounds. No murmur heard. Pulmonary:     Effort: Pulmonary effort is normal. No respiratory distress.     Breath sounds: Rhonchi present. No wheezing.  Abdominal:     General: Bowel sounds are normal. There is no distension.     Palpations: Abdomen is soft.     Tenderness: There is no abdominal tenderness.  Musculoskeletal:        General: No tenderness. Normal range of motion.     Cervical back: Normal range of motion and neck supple.  Skin:    General: Skin is warm and dry.  Neurological:     Mental Status: She is alert and oriented to person, place, and time.     Cranial Nerves: No cranial nerve deficit.     Deep Tendon Reflexes: Reflexes are normal and symmetric.  Psychiatric:        Behavior: Behavior normal.        Thought Content: Thought content normal.        Judgment: Judgment normal.  BP (!) 145/66   Pulse 68   Temp (!) 97 F (36.1 C)   Ht 5\' 5"  (1.651 m)   Wt 132 lb (59.9 kg)   SpO2 97%   BMI 21.97 kg/m   Assessment & Plan:  [name] comes in today with chief complaint of Medical Management of Chronic Issues (Patient wants flu shot today. Spiriva cost too much )   Diagnosis and orders addressed:  1. Encounter for immunization - CBC with Differential/Platelet - CMP14+EGFR - Flu Vaccine Trivalent High Dose (Fluad)  2. Annual physical exam (Primary) - CBC with Differential/Platelet - CMP14+EGFR - Lipid panel  3. Tobacco use disorder - CBC with Differential/Platelet - CMP14+EGFR  4. Overweight (BMI  25.0-29.9) - CBC with Differential/Platelet - CMP14+EGFR  5. Osteopenia with high risk of fracture - CBC with Differential/Platelet - CMP14+EGFR  6. Chronic obstructive pulmonary disease, unspecified COPD type (HCC) - CBC with Differential/Platelet - CMP14+EGFR  7. Hyperlipidemia, unspecified hyperlipidemia type - CBC with Differential/Platelet - CMP14+EGFR - Lipid panel  8. Hypertension, unspecified type - CBC with Differential/Platelet - CMP14+EGFR  9. Aortic atherosclerosis (HCC) - CBC with Differential/Platelet - CMP14+EGFR    Labs pending Continue current medications  She will call insurance to discuss deductible to discuss Spiriva  Health Maintenance reviewed Diet and exercise encouraged  Follow up plan: 6 months    Jannifer Rodney, FNP

## 2024-01-18 LAB — CMP14+EGFR
ALT: 21 [IU]/L (ref 0–32)
AST: 19 [IU]/L (ref 0–40)
Albumin: 4.3 g/dL (ref 3.8–4.8)
Alkaline Phosphatase: 93 [IU]/L (ref 44–121)
BUN/Creatinine Ratio: 22 (ref 12–28)
BUN: 14 mg/dL (ref 8–27)
Bilirubin Total: 0.6 mg/dL (ref 0.0–1.2)
CO2: 23 mmol/L (ref 20–29)
Calcium: 10 mg/dL (ref 8.7–10.3)
Chloride: 100 mmol/L (ref 96–106)
Creatinine, Ser: 0.64 mg/dL (ref 0.57–1.00)
Globulin, Total: 2.1 g/dL (ref 1.5–4.5)
Glucose: 95 mg/dL (ref 70–99)
Potassium: 4.8 mmol/L (ref 3.5–5.2)
Sodium: 138 mmol/L (ref 134–144)
Total Protein: 6.4 g/dL (ref 6.0–8.5)
eGFR: 93 mL/min/{1.73_m2} (ref >59)

## 2024-01-18 LAB — CBC WITH DIFFERENTIAL/PLATELET
Basophils Absolute: 0.1 10*3/uL (ref 0.0–0.2)
Basos: 1 %
EOS (ABSOLUTE): 0.1 10*3/uL (ref 0.0–0.4)
Eos: 1 %
Hematocrit: 44.6 % (ref 34.0–46.6)
Hemoglobin: 14.9 g/dL (ref 11.1–15.9)
Immature Grans (Abs): 0 10*3/uL (ref 0.0–0.1)
Immature Granulocytes: 0 %
Lymphocytes Absolute: 3.5 10*3/uL — ABNORMAL HIGH (ref 0.7–3.1)
Lymphs: 40 %
MCH: 31.2 pg (ref 26.6–33.0)
MCHC: 33.4 g/dL (ref 31.5–35.7)
MCV: 93 fL (ref 79–97)
Monocytes Absolute: 0.7 10*3/uL (ref 0.1–0.9)
Monocytes: 8 %
Neutrophils Absolute: 4.4 10*3/uL (ref 1.4–7.0)
Neutrophils: 50 %
Platelets: 307 10*3/uL (ref 150–450)
RBC: 4.78 x10E6/uL (ref 3.77–5.28)
RDW: 12.1 % (ref 11.7–15.4)
WBC: 8.8 10*3/uL (ref 3.4–10.8)

## 2024-01-18 LAB — LIPID PANEL
Chol/HDL Ratio: 2.1 {ratio} (ref 0.0–4.4)
Cholesterol, Total: 158 mg/dL (ref 100–199)
HDL: 76 mg/dL (ref >39)
LDL Chol Calc (NIH): 66 mg/dL (ref 0–99)
Triglycerides: 87 mg/dL (ref 0–149)
VLDL Cholesterol Cal: 16 mg/dL (ref 5–40)

## 2024-02-07 ENCOUNTER — Other Ambulatory Visit: Payer: Self-pay | Admitting: Family

## 2024-02-07 DIAGNOSIS — J449 Chronic obstructive pulmonary disease, unspecified: Secondary | ICD-10-CM

## 2024-02-14 ENCOUNTER — Other Ambulatory Visit: Payer: Self-pay | Admitting: Family

## 2024-02-14 DIAGNOSIS — E785 Hyperlipidemia, unspecified: Secondary | ICD-10-CM

## 2024-03-26 ENCOUNTER — Other Ambulatory Visit: Payer: Self-pay | Admitting: Family

## 2024-03-26 DIAGNOSIS — I10 Essential (primary) hypertension: Secondary | ICD-10-CM

## 2024-04-16 ENCOUNTER — Ambulatory Visit: Payer: Medicare Other

## 2024-04-16 VITALS — BP 113/58 | HR 68 | Ht 65.0 in | Wt 132.0 lb

## 2024-04-16 DIAGNOSIS — Z0000 Encounter for general adult medical examination without abnormal findings: Secondary | ICD-10-CM

## 2024-04-16 DIAGNOSIS — H524 Presbyopia: Secondary | ICD-10-CM | POA: Diagnosis not present

## 2024-04-16 DIAGNOSIS — Z122 Encounter for screening for malignant neoplasm of respiratory organs: Secondary | ICD-10-CM

## 2024-04-16 DIAGNOSIS — H43813 Vitreous degeneration, bilateral: Secondary | ICD-10-CM | POA: Diagnosis not present

## 2024-04-16 NOTE — Patient Instructions (Signed)
 Ms. Wamser , Thank you for taking time to come for your Medicare Wellness Visit. I appreciate your ongoing commitment to your health goals. Please review the following plan we discussed and let me know if I can assist you in the future.   Referrals/Orders/Follow-Ups/Clinician Recommendations: Please remember to make your appointment for your Lung Cancer Screening as we have discuss during your visit today. Thank you for your time.   This is a list of the screening recommended for you and due dates:  Health Maintenance  Topic Date Due   Screening for Lung Cancer  04/16/2024   COVID-19 Vaccine (3 - Moderna risk series) 05/02/2025*   Mammogram  05/22/2024   Flu Shot  07/25/2024   DTaP/Tdap/Td vaccine (2 - Td or Tdap) 10/28/2024   Cologuard (Stool DNA test)  03/29/2025   Medicare Annual Wellness Visit  04/16/2025   DEXA scan (bone density measurement)  05/23/2025   Pneumonia Vaccine  Completed   Hepatitis C Screening  Completed   Zoster (Shingles) Vaccine  Completed   HPV Vaccine  Aged Out   Meningitis B Vaccine  Aged Out   Colon Cancer Screening  Discontinued  *Topic was postponed. The date shown is not the original due date.    Advanced directives: (Declined) Advance directive discussed with you today. Even though you declined this today, please call our office should you change your mind, and we can give you the proper paperwork for you to fill out.  Next Medicare Annual Wellness Visit scheduled for next year: Yes

## 2024-04-16 NOTE — Progress Notes (Signed)
 Subjective:   Brianna [name] is a 73 y.o. who presents for a Medicare Wellness preventive visit.  Visit Complete: Virtual I connected with  Brianna [name] on 04/16/24 by a audio enabled telemedicine application and verified that I am speaking with the correct person using two identifiers.  Patient Location: Home  Provider Location: Home Office  I discussed the limitations of evaluation and management by telemedicine. The patient expressed understanding and agreed to proceed.  Vital Signs: Because this visit was a virtual/telehealth visit, some criteria may be missing or patient reported. Any vitals not documented were not able to be obtained and vitals that have been documented are patient reported.  VideoDeclined- This patient declined Librarian, academic. Therefore the visit was completed with audio only.  Persons Participating in Visit: Patient.  AWV Questionnaire: No: Patient Medicare AWV questionnaire was not completed prior to this visit.  Cardiac Risk Factors include: advanced age (>42men, >80 women);dyslipidemia;hypertension;smoking/ tobacco exposure     Objective:    Today's Vitals   04/16/24 1238  BP: (!) 113/58  Pulse: 68  Weight: 132 lb (59.9 kg)  Height: 5\' 5"  (1.651 m)   Body mass index is 21.97 kg/m.     04/16/2024   12:45 PM 04/13/2023    2:47 PM 02/06/2023   12:12 PM 01/30/2023   10:20 AM 04/07/2022    3:04 PM 04/06/2021    3:51 PM 02/26/2020    3:52 PM  Advanced Directives  Does Patient Have a Medical Advance Directive? No No No No No No No  Would patient like information on creating a medical advance directive?  No - Patient declined No - Patient declined No - Patient declined No - Patient declined Yes (MAU/Ambulatory/Procedural Areas - Information given) No - Patient declined    Current Medications (verified) Outpatient Encounter Medications as of 04/16/2024  Medication Sig   albuterol  (VENTOLIN  HFA) 108 (90 Base) MCG/ACT inhaler  INHALE 1 TO 2 PUFFS EVERY 6 HOURS AS NEEDED FOR WHEEZING OR SHORTNESS OF BREATH   atorvastatin  (LIPITOR) 20 MG tablet TAKE ONE TABLET ONCE DAILY   Calcium  Carbonate-Vitamin D  (CALCIUM  600+D) 600-400 MG-UNIT tablet Take 1 tablet by mouth daily.   lisinopril  (ZESTRIL ) 10 MG tablet TAKE ONE TABLET EVERY DAY   mometasone  (NASONEX ) 50 MCG/ACT nasal spray Place 2 sprays into the nose daily.   Probiotic Product (PROBIOTIC-10 PO) Take by mouth.   SPIRIVA  RESPIMAT 2.5 MCG/ACT AERS INHALE 2 PUFFS INTO LUNGS DAILY   traMADol  (ULTRAM ) 50 MG tablet Take 1 tablet (50 mg total) by mouth every 8 (eight) hours as needed for up to 15 doses for moderate pain.   No facility-administered encounter medications on file as of 04/16/2024.    Allergies (verified) Doxycycline    History: Past Medical History:  Diagnosis Date   Cataract    HLD (hyperlipidemia)    Hypertension    PONV (postoperative nausea and vomiting)    Past Surgical History:  Procedure Laterality Date   BREAST LUMPECTOMY Left 90's   PRIMARY CLOSURE Left 02/06/2023   Procedure: Closure of Mohs defect, left chin, complex closure, local tissue rearrangement, rotational flap;  Surgeon: Teretha Ferguson, MD;  Location: [facility];  Service: Plastics;  Laterality: Left;   TUBAL LIGATION     Family History  Problem Relation Age of Onset   Heart disease Mother    Osteoporosis Mother    Hypertension Mother    Cancer Father        lung  Diabetes Father    Cancer Sister        brain   Diabetes Sister    Osteoporosis Sister    Breast cancer Sister    Cancer Sister        breast   Cancer Brother        brain   Diabetes Brother    Diabetes Brother    Social History   Socioeconomic History   Marital status: Married    Spouse name: Brianna [name]   Number of children: 2   Years of education: Not on file   Highest education level: Bachelor's degree (e.g., BA, AB, BS)  Occupational History   Occupation: Retired    Associate Professor:  [employer]    Comment: [employer]  Tobacco Use   Smoking status: Heavy Smoker    Current packs/day: 1.50    Average packs/day: 1.5 packs/day for 46.0 years (69.0 ttl pk-yrs)    Types: Cigarettes   Smokeless tobacco: Never  Vaping Use   Vaping status: Never Used  Substance and Sexual Activity   Alcohol use: Yes    Comment: occasional   Drug use: No   Sexual activity: Not Currently  Other Topics Concern   Not on file  Social History Narrative   Brianna [name] is retired and lives with husband Brianna [name]. They have 2 children that they see regularly as well as grandchildren. She likes to read and spend time with the grandchildren. They have a dog that she walks multiple times per day for exercise.    Social Drivers of Corporate investment banker Strain: Low Risk  (04/16/2024)   Overall Financial Resource Strain (CARDIA)    Difficulty of Paying Living Expenses: Not hard at all  Food Insecurity: No Food Insecurity (04/16/2024)   Hunger Vital Sign    Worried About Running Out of Food in the Last Year: Never true    Ran Out of Food in the Last Year: Never true  Transportation Needs: No Transportation Needs (04/16/2024)   PRAPARE - Administrator, Civil Service (Medical): No    Lack of Transportation (Non-Medical): No  Physical Activity: Insufficiently Active (04/16/2024)   Exercise Vital Sign    Days of Exercise per Week: 3 days    Minutes of Exercise per Session: 20 min  Stress: No Stress Concern Present (04/16/2024)   [employer] of Occupational Health - Occupational Stress Questionnaire    Feeling of Stress : Not at all  Social Connections: Moderately Integrated (04/16/2024)   Social Connection and Isolation Panel [NHANES]    Frequency of Communication with Friends and Family: More than three times a week    Frequency of Social Gatherings with Friends and Family: More than three times a week    Attends Religious Services: 1 to 4 times per year    Active Member of [employer]  or Organizations: No    Attends Engineer, structural: Never    Marital Status: Married    Tobacco Counseling Ready to quit: Not Answered Counseling given: Yes    Clinical Intake:  Pre-visit preparation completed: Yes  Pain : No/denies pain     BMI - recorded: 21.97 (pt wears glasses, who [facility] in [place], [place]) Nutritional Status: BMI of 19-24  Normal Nutritional Risks: None Diabetes: No  No results found for: "HGBA1C"   How often do you need to have someone help you when you read instructions, pamphlets, or other written materials from your doctor or pharmacy?: 1 -  Never  Interpreter Needed?: No  Information entered by :: Alia T/CMA   Activities of Daily Living     04/16/2024   12:44 PM  In your present state of health, do you have any difficulty performing the following activities:  Hearing? 0  Vision? 0  Difficulty concentrating or making decisions? 0  Walking or climbing stairs? 0  Dressing or bathing? 0  Doing errands, shopping? 0  Preparing Food and eating ? N  Using the Toilet? N  In the past six months, have you accidently leaked urine? Y  Do you have problems with loss of bowel control? N  Managing your Medications? N  Managing your Finances? N  Housekeeping or managing your Housekeeping? N    Patient Care Team: Yevette Hem, FNP as PCP - General (Family Medicine) Dr [facility], OD (Optometry)  Indicate any recent Medical Services you may have received from other than [facility] providers in the past year (date may be approximate).     Assessment:   This is a routine wellness examination for Anavi.  Hearing/Vision screen Hearing Screening - Comments:: Pt denies hearing dif Vision Screening - Comments:: Pt denies any vision dif-[facility]   Goals Addressed             This Visit's Progress    Exercise 3x per week (30 min per time)   On track    She would like to get more  active Goals Addressed             This Visit's Progress    Exercise 3x per week (30 min per time)       She would like to get more active     Patient Stated   On track    02/26/2020 AWV Goal: Keep All Scheduled Appointments  Over the next year, patient will attend all scheduled appointments with their PCP and any specialists that they see.      Quit Smoking                Depression Screen     04/16/2024   12:49 PM 01/17/2024    9:51 AM 07/17/2023   10:18 AM 04/13/2023    2:46 PM 01/16/2023   11:22 AM 04/07/2022    3:00 PM 03/07/2022    8:58 AM  PHQ 2/9 Scores  PHQ - 2 Score 0 0 0 0 0 0 0  PHQ- 9 Score 1 0 0  0 0 0    Fall Risk     04/16/2024   12:40 PM 07/17/2023   10:18 AM 04/13/2023    2:44 PM 01/16/2023   11:22 AM 04/07/2022    3:04 PM  Fall Risk   Falls in the past year? 0 0 0 0 0  Number falls in past yr: 0 0 0  0  Injury with Fall? 0 0 0  0  Risk for fall due to : No Fall Risks No Fall Risks No Fall Risks  No Fall Risks  Follow up Falls prevention discussed;Falls evaluation completed Education provided;Falls evaluation completed Falls prevention discussed  Falls prevention discussed    MEDICARE RISK AT HOME:  Medicare Risk at Home Any stairs in or around the home?: No If so, are there any without handrails?: No Home free of loose throw rugs in walkways, pet beds, electrical cords, etc?: Yes Adequate lighting in your home to reduce risk of falls?: Yes Life alert?: No Use of  a cane, walker or w/c?: No Grab bars in the bathroom?: No Shower chair or bench in shower?: Yes Elevated toilet seat or a handicapped toilet?: Yes  TIMED UP AND GO:  Was the test performed?  no  Cognitive Function: 6CIT completed    02/25/2019    2:33 PM  MMSE - Mini Mental State Exam  Orientation to time 5  Orientation to Place 5  Registration 3  Attention/ Calculation 5  Recall 3  Language- name 2 objects 2  Language- repeat 1  Language- follow 3 step command 3   Language- read & follow direction 1  Write a sentence 1  Copy design 1  Total score 30        04/16/2024   12:52 PM 04/13/2023    2:47 PM 04/07/2022    3:08 PM 02/26/2020    3:55 PM  6CIT Screen  What Year? 0 points 0 points 0 points 0 points  What month? 0 points 0 points 0 points 0 points  What time? 0 points 0 points 0 points 0 points  Count back from 20 0 points 0 points 0 points 0 points  Months in reverse 0 points 0 points 0 points 0 points  Repeat phrase 0 points 0 points 0 points 0 points  Total Score 0 points 0 points 0 points 0 points    Immunizations Immunization History  Administered Date(s) Administered   Fluad Quad(high Dose 65+) 11/23/2020, 09/12/2021   Fluad Trivalent(High Dose 65+) 01/17/2024   Influenza Split 09/24/2013   Influenza, High Dose Seasonal PF 09/23/2016, 02/20/2018   Influenza,inj,Quad PF,6+ Mos 10/28/2014, 12/03/2015   Influenza,inj,quad, With Preservative 01/06/2019   Moderna Sars-Covid-2 Vaccination 03/25/2020, 04/22/2020   Pneumococcal Conjugate-13 12/03/2015   Pneumococcal Polysaccharide-23 02/20/2018   Tdap 10/28/2014   Zoster Recombinant(Shingrix ) 03/07/2022, 01/16/2023    Screening Tests Health Maintenance  Topic Date Due   Lung Cancer Screening  04/16/2024   COVID-19 Vaccine (3 - Moderna risk series) 05/02/2025 (Originally 05/20/2020)   MAMMOGRAM  05/22/2024   INFLUENZA VACCINE  07/25/2024   DTaP/Tdap/Td (2 - Td or Tdap) 10/28/2024   Fecal DNA (Cologuard)  03/29/2025   Medicare Annual Wellness (AWV)  04/16/2025   DEXA SCAN  05/23/2025   Pneumonia Vaccine 60+ Years old  Completed   Hepatitis C Screening  Completed   Zoster Vaccines- Shingrix   Completed   HPV VACCINES  Aged Out   Meningococcal B Vaccine  Aged Out   Colonoscopy  Discontinued    Health Maintenance  Health Maintenance Due  Topic Date Due   Lung Cancer Screening  04/16/2024   Health Maintenance Items Addressed: Lung Cancer Screening ordered  Additional  Screening:  Vision Screening: Recommended annual ophthalmology exams for early detection of glaucoma and other disorders of the eye.  Dental Screening: Recommended annual dental exams for proper oral hygiene  Community Resource Referral / Chronic Care Management: CRR required this visit?  No   CCM required this visit?  No     Plan:     I have personally reviewed and noted the following in the patient's chart:   Medical and social history Use of alcohol, tobacco or illicit drugs  Current medications and supplements including opioid prescriptions. Patient is not currently taking opioid prescriptions. Functional ability and status Nutritional status Physical activity Advanced directives List of other physicians Hospitalizations, surgeries, and ER visits in previous 12 months Vitals Screenings to include cognitive, depression, and falls Referrals and appointments  In addition, I have reviewed and discussed  with patient certain preventive protocols, quality metrics, and best practice recommendations. A written personalized care plan for preventive services as well as general preventive health recommendations were provided to patient.     Michaelle Adolphus, CMA   04/16/2024   After Visit Summary: (MyChart) Due to this being a telephonic visit, the after visit summary with patients personalized plan was offered to patient via MyChart   Notes:  pt is due for lung cancer screening, order placed

## 2024-04-17 ENCOUNTER — Other Ambulatory Visit: Payer: Self-pay | Admitting: Family

## 2024-04-17 DIAGNOSIS — J449 Chronic obstructive pulmonary disease, unspecified: Secondary | ICD-10-CM

## 2024-05-06 DIAGNOSIS — H25812 Combined forms of age-related cataract, left eye: Secondary | ICD-10-CM | POA: Diagnosis not present

## 2024-05-06 DIAGNOSIS — Z01818 Encounter for other preprocedural examination: Secondary | ICD-10-CM | POA: Diagnosis not present

## 2024-05-06 DIAGNOSIS — H2513 Age-related nuclear cataract, bilateral: Secondary | ICD-10-CM | POA: Diagnosis not present

## 2024-05-14 DIAGNOSIS — H25812 Combined forms of age-related cataract, left eye: Secondary | ICD-10-CM | POA: Diagnosis not present

## 2024-05-14 DIAGNOSIS — F17219 Nicotine dependence, cigarettes, with unspecified nicotine-induced disorders: Secondary | ICD-10-CM | POA: Diagnosis not present

## 2024-05-14 DIAGNOSIS — J449 Chronic obstructive pulmonary disease, unspecified: Secondary | ICD-10-CM | POA: Diagnosis not present

## 2024-05-14 DIAGNOSIS — I10 Essential (primary) hypertension: Secondary | ICD-10-CM | POA: Diagnosis not present

## 2024-05-28 DIAGNOSIS — J449 Chronic obstructive pulmonary disease, unspecified: Secondary | ICD-10-CM | POA: Diagnosis not present

## 2024-05-28 DIAGNOSIS — I10 Essential (primary) hypertension: Secondary | ICD-10-CM | POA: Diagnosis not present

## 2024-05-28 DIAGNOSIS — H25811 Combined forms of age-related cataract, right eye: Secondary | ICD-10-CM | POA: Diagnosis not present

## 2024-05-28 DIAGNOSIS — F17219 Nicotine dependence, cigarettes, with unspecified nicotine-induced disorders: Secondary | ICD-10-CM | POA: Diagnosis not present

## 2024-05-28 DIAGNOSIS — H2511 Age-related nuclear cataract, right eye: Secondary | ICD-10-CM | POA: Diagnosis not present

## 2024-06-04 ENCOUNTER — Other Ambulatory Visit: Payer: Self-pay | Admitting: Family

## 2024-06-04 DIAGNOSIS — J449 Chronic obstructive pulmonary disease, unspecified: Secondary | ICD-10-CM

## 2024-07-15 ENCOUNTER — Encounter: Payer: Self-pay | Admitting: *Deleted

## 2024-07-15 ENCOUNTER — Other Ambulatory Visit

## 2024-07-15 ENCOUNTER — Ambulatory Visit (INDEPENDENT_AMBULATORY_CARE_PROVIDER_SITE_OTHER): Payer: Medicare Other | Admitting: Family

## 2024-07-15 ENCOUNTER — Encounter: Payer: Self-pay | Admitting: Family

## 2024-07-15 VITALS — BP 124/70 | HR 74 | Temp 98.0°F | Wt 128.0 lb

## 2024-07-15 DIAGNOSIS — I700 Atherosclerosis of aorta: Secondary | ICD-10-CM | POA: Diagnosis not present

## 2024-07-15 DIAGNOSIS — E785 Hyperlipidemia, unspecified: Secondary | ICD-10-CM

## 2024-07-15 DIAGNOSIS — M8580 Other specified disorders of bone density and structure, unspecified site: Secondary | ICD-10-CM

## 2024-07-15 DIAGNOSIS — J449 Chronic obstructive pulmonary disease, unspecified: Secondary | ICD-10-CM

## 2024-07-15 DIAGNOSIS — F17200 Nicotine dependence, unspecified, uncomplicated: Secondary | ICD-10-CM

## 2024-07-15 DIAGNOSIS — I10 Essential (primary) hypertension: Secondary | ICD-10-CM

## 2024-07-15 DIAGNOSIS — Z122 Encounter for screening for malignant neoplasm of respiratory organs: Secondary | ICD-10-CM | POA: Diagnosis not present

## 2024-07-15 DIAGNOSIS — E663 Overweight: Secondary | ICD-10-CM

## 2024-07-15 MED ORDER — ALBUTEROL SULFATE HFA 108 (90 BASE) MCG/ACT IN AERS: 2.0000 | INHALATION_SPRAY | Freq: Four times a day (QID) | RESPIRATORY_TRACT | 2 refills | Status: DC | PRN

## 2024-07-15 MED ORDER — LISINOPRIL 10 MG PO TABS
10.0000 mg | ORAL_TABLET | Freq: Every day | ORAL | 1 refills | Status: AC
Start: 2024-07-15 — End: ?

## 2024-07-15 MED ORDER — MOMETASONE FUROATE 50 MCG/ACT NA SUSP: 2.0000 | Freq: Every day | NASAL | 12 refills | Status: AC

## 2024-07-15 MED ORDER — ATORVASTATIN CALCIUM 20 MG PO TABS: 20.0000 mg | ORAL_TABLET | Freq: Every day | ORAL | 1 refills | Status: AC

## 2024-07-15 MED ORDER — SPIRIVA RESPIMAT 2.5 MCG/ACT IN AERS: INHALATION_SPRAY | RESPIRATORY_TRACT | 2 refills | Status: AC

## 2024-07-15 MED ORDER — ASPIRIN 81 MG PO TBEC: 81.0000 mg | DELAYED_RELEASE_TABLET | Freq: Every day | ORAL | 3 refills | Status: AC

## 2024-07-15 NOTE — Patient Instructions (Signed)
 Health Maintenance After Age 74 After age 4, you are at a higher risk for certain long-term diseases and infections as well as injuries from falls. Falls are a major cause of broken bones and head injuries in people who are older than age 47. Getting regular preventive care can help to keep you healthy and well. Preventive care includes getting regular testing and making lifestyle changes as recommended by your health care provider. Talk with your health care provider about: Which screenings and tests you should have. A screening is a test that checks for a disease when you have no symptoms. A diet and exercise plan that is right for you. What should I know about screenings and tests to prevent falls? Screening and testing are the best ways to find a health problem early. Early diagnosis and treatment give you the best chance of managing medical conditions that are common after age 37. Certain conditions and lifestyle choices may make you more likely to have a fall. Your health care provider may recommend: Regular vision checks. Poor vision and conditions such as cataracts can make you more likely to have a fall. If you wear glasses, make sure to get your prescription updated if your vision changes. Medicine review. Work with your health care provider to regularly review all of the medicines you are taking, including over-the-counter medicines. Ask your health care provider about any side effects that may make you more likely to have a fall. Tell your health care provider if any medicines that you take make you feel dizzy or sleepy. Strength and balance checks. Your health care provider may recommend certain tests to check your strength and balance while standing, walking, or changing positions. Foot health exam. Foot pain and numbness, as well as not wearing proper footwear, can make you more likely to have a fall. Screenings, including: Osteoporosis screening. Osteoporosis is a condition that causes  the bones to get weaker and break more easily. Blood pressure screening. Blood pressure changes and medicines to control blood pressure can make you feel dizzy. Depression screening. You may be more likely to have a fall if you have a fear of falling, feel depressed, or feel unable to do activities that you used to do. Alcohol use screening. Using too much alcohol can affect your balance and may make you more likely to have a fall. Follow these instructions at home: Lifestyle Do not drink alcohol if: Your health care provider tells you not to drink. If you drink alcohol: Limit how much you have to: 0-1 drink a day for women. 0-2 drinks a day for men. Know how much alcohol is in your drink. In the [place] one drink equals one 12 oz bottle of beer (355 mL), one 5 oz glass of wine (148 mL), or one 1 oz glass of hard liquor (44 mL). Do not use any products that contain nicotine or tobacco. These products include cigarettes, chewing tobacco, and vaping devices, such as e-cigarettes. If you need help quitting, ask your health care provider. Activity  Follow a regular exercise program to stay fit. This will help you maintain your balance. Ask your health care provider what types of exercise are appropriate for you. If you need a cane or walker, use it as recommended by your health care provider. Wear supportive shoes that have nonskid soles. Safety  Remove any tripping hazards, such as rugs, cords, and clutter. Install safety equipment such as grab bars in bathrooms and safety rails on stairs. Keep rooms and walkways  well-lit. General instructions Talk with your health care provider about your risks for falling. Tell your health care provider if: You fall. Be sure to tell your health care provider about all falls, even ones that seem minor. You feel dizzy, tiredness (fatigue), or off-balance. Take over-the-counter and prescription medicines only as told by your health care provider. These include  supplements. Eat a healthy diet and maintain a healthy weight. A healthy diet includes low-fat dairy products, low-fat (lean) meats, and fiber from whole grains, beans, and lots of fruits and vegetables. Stay current with your vaccines. Schedule regular health, dental, and eye exams. Summary Having a healthy lifestyle and getting preventive care can help to protect your health and wellness after age 11. Screening and testing are the best way to find a health problem early and help you avoid having a fall. Early diagnosis and treatment give you the best chance for managing medical conditions that are more common for people who are older than age 28. Falls are a major cause of broken bones and head injuries in people who are older than age 48. Take precautions to prevent a fall at home. Work with your health care provider to learn what changes you can make to improve your health and wellness and to prevent falls. This information is not intended to replace advice given to you by your health care provider. Make sure you discuss any questions you have with your health care provider. Document Revised: 05/02/2021 Document Reviewed: 05/02/2021 Elsevier Patient Education  2024 [employer].

## 2024-07-15 NOTE — Progress Notes (Signed)
 Subjective:    Patient ID: Brianna [name], female    DOB: 03-06-1951, 73 y.o.   MRN: [id number]  Chief Complaint  Patient presents with   Medical Management of Chronic Issues    Cramps in legs and feet at night last couple of nights have not had any.   Pt presents to the office today for chronic follow up.   She had Moh surgery on 02/24.    She has COPD and she had decreased to 1/2  pack a day. States she has intermittent SOB with exertion. She uses Spiriva  daily and albuterol  as needed.    She osteopenia and takes calcium  and vit d daily. She walks most days for 30 mins a day. Last Dexa scan 05/25/23.   Pt has Aortic atherosclerosis and takes Lipitor daily.  Hypertension This is a chronic problem. The current episode started more than 1 year ago. The problem has been resolved since onset. The problem is controlled. Associated symptoms include shortness of breath. Pertinent negatives include no malaise/fatigue or peripheral edema. Risk factors for coronary artery disease include dyslipidemia, sedentary lifestyle and smoking/tobacco exposure. The current treatment provides moderate improvement.  Hyperlipidemia This is a chronic problem. The current episode started more than 1 year ago. The problem is controlled. Recent lipid tests were reviewed and are normal. Factors aggravating her hyperlipidemia include smoking. Associated symptoms include shortness of breath. Current antihyperlipidemic treatment includes statins. The current treatment provides moderate improvement of lipids. Risk factors for coronary artery disease include dyslipidemia, hypertension, a sedentary lifestyle and post-menopausal.  Nicotine Dependence Presents for follow-up visit. She smokes < 1/2 a pack of cigarettes per day.      Review of Systems  Constitutional:  Negative for malaise/fatigue.  Respiratory:  Positive for shortness of breath.   All other systems reviewed and are negative.  Family History  Problem  Relation Age of Onset   Heart disease Mother    Osteoporosis Mother    Hypertension Mother    Cancer Father        lung   Diabetes Father    Cancer Sister        brain   Diabetes Sister    Osteoporosis Sister    Breast cancer Sister    Cancer Sister        breast   Cancer Brother        brain   Diabetes Brother    Diabetes Brother    Social History   Socioeconomic History   Marital status: Married    Spouse name: [name]   Number of children: 2   Years of education: Not on file   Highest education level: Bachelor's degree (e.g., BA, AB, BS)  Occupational History   Occupation: Retired    Associate Professor: [employer]    Comment: Bank Teller  Tobacco Use   Smoking status: Heavy Smoker    Current packs/day: 1.50    Average packs/day: 1.5 packs/day for 46.0 years (69.0 ttl pk-yrs)    Types: Cigarettes   Smokeless tobacco: Never  Vaping Use   Vaping status: Never Used  Substance and Sexual Activity   Alcohol use: Yes    Comment: occasional   Drug use: No   Sexual activity: Not Currently  Other Topics Concern   Not on file  Social History Narrative   Brianna [name] is retired and lives with husband [name]. They have 2 children that they see regularly as well as grandchildren. She likes to read and spend time with the  grandchildren. They have a dog that she walks multiple times per day for exercise.    Social Drivers of Corporate investment banker Strain: Low Risk  (04/16/2024)   Overall Financial Resource Strain (CARDIA)    Difficulty of Paying Living Expenses: Not hard at all  Food Insecurity: No Food Insecurity (04/16/2024)   Hunger Vital Sign    Worried About Running Out of Food in the Last Year: Never true    Ran Out of Food in the Last Year: Never true  Transportation Needs: No Transportation Needs (04/16/2024)   PRAPARE - Administrator, Civil Service (Medical): No    Lack of Transportation (Non-Medical): No  Physical Activity: Insufficiently Active (04/16/2024)    Exercise Vital Sign    Days of Exercise per Week: 3 days    Minutes of Exercise per Session: 20 min  Stress: No Stress Concern Present (04/16/2024)   [employer] of Occupational Health - Occupational Stress Questionnaire    Feeling of Stress : Not at all  Social Connections: Moderately Integrated (04/16/2024)   Social Connection and Isolation Panel    Frequency of Communication with Friends and Family: More than three times a week    Frequency of Social Gatherings with Friends and Family: More than three times a week    Attends Religious Services: 1 to 4 times per year    Active Member of [employer] or Organizations: No    Attends Banker Meetings: Never    Marital Status: Married       Objective:   Physical Exam Vitals reviewed.  Constitutional:      General: She is not in acute distress.    Appearance: She is well-developed.  HENT:     Head: Normocephalic and atraumatic.     Right Ear: Tympanic membrane normal.     Left Ear: Tympanic membrane normal.  Eyes:     Pupils: Pupils are equal, round, and reactive to light.  Neck:     Thyroid : No thyromegaly.  Cardiovascular:     Rate and Rhythm: Normal rate and regular rhythm.     Heart sounds: Normal heart sounds. No murmur heard. Pulmonary:     Effort: Pulmonary effort is normal. No respiratory distress.     Breath sounds: Rhonchi present. No wheezing.  Abdominal:     General: Bowel sounds are normal. There is no distension.     Palpations: Abdomen is soft.     Tenderness: There is no abdominal tenderness.  Musculoskeletal:        General: No tenderness. Normal range of motion.     Cervical back: Normal range of motion and neck supple.  Skin:    General: Skin is warm and dry.  Neurological:     Mental Status: She is alert and oriented to person, place, and time.     Cranial Nerves: No cranial nerve deficit.     Deep Tendon Reflexes: Reflexes are normal and symmetric.  Psychiatric:        Behavior:  Behavior normal.        Thought Content: Thought content normal.        Judgment: Judgment normal.        BP 124/70   Pulse 74   Temp 98 F (36.7 C) (Temporal)   Wt 128 lb (58.1 kg)   SpO2 97%   BMI 21.30 kg/m   Assessment & Plan:  Brianna [name] comes in today with chief complaint of Medical Management of Chronic Issues (  Cramps in legs and feet at night last couple of nights have not had any.)   Diagnosis and orders addressed:  1. Chronic obstructive pulmonary disease, unspecified COPD type (HCC) - albuterol  (VENTOLIN  HFA) 108 (90 Base) MCG/ACT inhaler; Inhale 2 puffs into the lungs every 6 (six) hours as needed for wheezing or shortness of breath.  Dispense: 8.5 g; Refill: 2 - mometasone  (NASONEX ) 50 MCG/ACT nasal spray; Place 2 sprays into the nose daily.  Dispense: 1 each; Refill: 12 - Tiotropium Bromide  Monohydrate (SPIRIVA  RESPIMAT) 2.5 MCG/ACT AERS; INHALE 2 PUFFS INTO LUNGS DAILY  Dispense: 4 g; Refill: 2 - CMP14+EGFR - Ambulatory Referral Lung Cancer Screening [facility]  2. Hyperlipidemia, unspecified hyperlipidemia type - atorvastatin  (LIPITOR) 20 MG tablet; Take 1 tablet (20 mg total) by mouth daily.  Dispense: 90 tablet; Refill: 1 - CMP14+EGFR  3. Hypertension, unspecified type (Primary) - lisinopril  (ZESTRIL ) 10 MG tablet; Take 1 tablet (10 mg total) by mouth daily.  Dispense: 90 tablet; Refill: 1 - CMP14+EGFR  4. Screening for lung cancer - CMP14+EGFR - Ambulatory Referral Lung Cancer Screening [facility]  5. Tobacco use disorder - CMP14+EGFR  6. Overweight (BMI 25.0-29.9) - CMP14+EGFR  7. Osteopenia with high risk of fracture - CMP14+EGFR  8. Aortic atherosclerosis (HCC) - CMP14+EGFR - aspirin  EC 81 MG tablet; Take 1 tablet (81 mg total) by mouth daily. Swallow whole.  Dispense: 90 tablet; Refill: 3     Labs pending Continue current medications- Will schedule mammogram and lung cancer screening Health Maintenance reviewed Diet and  exercise encouraged  Follow up plan: 6 months    Bari Learn, FNP

## 2024-07-16 LAB — CMP14+EGFR
ALT: 20 IU/L (ref 0–32)
AST: 17 IU/L (ref 0–40)
Albumin: 4 g/dL (ref 3.8–4.8)
Alkaline Phosphatase: 84 IU/L (ref 44–121)
BUN/Creatinine Ratio: 13 (ref 12–28)
BUN: 12 mg/dL (ref 8–27)
Bilirubin Total: 0.4 mg/dL (ref 0.0–1.2)
CO2: 23 mmol/L (ref 20–29)
Calcium: 9.4 mg/dL (ref 8.7–10.3)
Chloride: 102 mmol/L (ref 96–106)
Creatinine, Ser: 0.91 mg/dL (ref 0.57–1.00)
Globulin, Total: 2.1 g/dL (ref 1.5–4.5)
Glucose: 81 mg/dL (ref 70–99)
Potassium: 4.9 mmol/L (ref 3.5–5.2)
Sodium: 137 mmol/L (ref 134–144)
Total Protein: 6.1 g/dL (ref 6.0–8.5)
eGFR: 66 mL/min/1.73 (ref >59)

## 2024-07-23 ENCOUNTER — Telehealth: Payer: Self-pay | Admitting: Family

## 2024-07-23 DIAGNOSIS — Z122 Encounter for screening for malignant neoplasm of respiratory organs: Secondary | ICD-10-CM

## 2024-07-23 DIAGNOSIS — J449 Chronic obstructive pulmonary disease, unspecified: Secondary | ICD-10-CM

## 2024-07-23 NOTE — Telephone Encounter (Signed)
 Patient is inquiring on a CT Scan of her chest.  Has this been ordered?  Please call.

## 2024-07-23 NOTE — Telephone Encounter (Signed)
 Per last referral note patient never returned call to put in new referral is she called back. Placed new referral for patient and she is aware.

## 2024-07-24 ENCOUNTER — Encounter

## 2024-07-24 ENCOUNTER — Ambulatory Visit: Payer: Self-pay | Admitting: Nurse Practitioner

## 2024-07-29 ENCOUNTER — Telehealth: Payer: Self-pay | Admitting: Acute Care

## 2024-07-29 DIAGNOSIS — F17210 Nicotine dependence, cigarettes, uncomplicated: Secondary | ICD-10-CM

## 2024-07-29 DIAGNOSIS — Z122 Encounter for screening for malignant neoplasm of respiratory organs: Secondary | ICD-10-CM

## 2024-07-29 DIAGNOSIS — Z87891 Personal history of nicotine dependence: Secondary | ICD-10-CM

## 2024-07-29 NOTE — Telephone Encounter (Signed)
 Lung Cancer Screening Narrative/Criteria Questionnaire (Cigarette Smokers Only- No Cigars/Pipes/vapes)   Brianna [name]   SDMV:08/04/24 at 10am/Kristen                                           10/02/1951              LDCT: 08/12/24 at 11am/DWB    73 y.o.   Phone: [phone]  Lung Screening Narrative (confirm age 79-77 yrs Medicare / 50-80 yrs Private pay insurance)   Insurance information:UHC medicare   Referring Provider:[facility]   This screening involves an initial phone call with a team member from our program. It is called a shared decision making visit. The initial meeting is required by insurance and Medicare to make sure you understand the program. This appointment takes about 15-20 minutes to complete. The CT scan will completed at a separate date/time. This scan takes about 5-10 minutes to complete and you may eat and drink before and after the scan.  Criteria questions for Lung Cancer Screening:   Are you a current or former smoker? Current Age began smoking: 19y   If you are a former smoker, what year did you quit smoking? NA   To calculate your smoking history, I need an accurate estimate of how many packs of cigarettes you smoked per day and for how many years. (Not just the number of PPD you are now smoking)   Years smoking 54 x Packs per day 1/2 to 1 = Pack years 41   (at least 20 pack yrs)   (Make sure they understand that we need to know how much they have smoked in the past, not just the number of PPD they are smoking now)  Do you have a personal history of cancer?  No    Do you have a family history of cancer? Yes  (cancer type and and relative) father/lung, sister/brain, sister#2/breast, brother/ brain  Are you coughing up blood?  No  Have you had unexplained weight loss of 15 lbs or more in the last 6 months? No  It looks like you meet all criteria.     Additional information: N/A

## 2024-08-01 ENCOUNTER — Other Ambulatory Visit: Payer: Self-pay | Admitting: Family

## 2024-08-01 DIAGNOSIS — Z1231 Encounter for screening mammogram for malignant neoplasm of breast: Secondary | ICD-10-CM

## 2024-08-04 ENCOUNTER — Ambulatory Visit
Admission: RE | Admit: 2024-08-04 | Discharge: 2024-08-04 | Disposition: A | Discharge status: Home / Self Care | Source: Ambulatory Visit | Attending: Family | Admitting: Family

## 2024-08-04 ENCOUNTER — Encounter: Payer: Self-pay | Admitting: *Deleted

## 2024-08-04 ENCOUNTER — Ambulatory Visit (INDEPENDENT_AMBULATORY_CARE_PROVIDER_SITE_OTHER): Admitting: *Deleted

## 2024-08-04 DIAGNOSIS — F17210 Nicotine dependence, cigarettes, uncomplicated: Secondary | ICD-10-CM | POA: Diagnosis not present

## 2024-08-04 DIAGNOSIS — Z1231 Encounter for screening mammogram for malignant neoplasm of breast: Secondary | ICD-10-CM

## 2024-08-04 NOTE — Progress Notes (Addendum)
 Virtual Visit via Telephone Note  I connected with Keyairra Kolinski on 08/04/24 at 10:00 AM EDT by telephone and verified that I am speaking with the correct person using two identifiers.  Location: Patient: in home Provider: [address] [place], [place], [address]    Shared Decision Making Visit Lung Cancer Screening Program [phone])   Eligibility: Age 73 y.o. Pack Years Smoking History Calculation 41 (# packs/per year x # years smoked) Recent History of coughing up blood  no Unexplained weight loss? no ( >Than 15 pounds within the last 6 months ) Prior History Lung / other cancer no (Diagnosis within the last 5 years already requiring surveillance chest CT Scans). Smoking Status Current Smoker Former Smokers: Years since quit:  NA  Quit Date: NA  Visit Components: Discussion included one or more decision making aids. yes Discussion included risk/benefits of screening. yes Discussion included potential follow up diagnostic testing for abnormal scans. yes Discussion included meaning and risk of over diagnosis. yes Discussion included meaning and risk of False Positives. yes Discussion included meaning of total radiation exposure. yes  Counseling Included: Importance of adherence to annual lung cancer LDCT screening. yes Impact of comorbidities on ability to participate in the program. yes Ability and willingness to under diagnostic treatment. yes  Smoking Cessation Counseling: Current Smokers:  Discussed importance of smoking cessation. yes Information about tobacco cessation classes and interventions provided to patient. yes Patient provided with ticket for LDCT Scan. yes Symptomatic Patient. no  Counseling NA Diagnosis Code: Tobacco Use Z72.0 Asymptomatic Patient yes  Counseling 3-4 minutes spent in counseling Former Smokers:  Discussed the importance of maintaining cigarette abstinence. yes Diagnosis Code: Personal History of Nicotine Dependence.  S12.108 Information about tobacco cessation classes and interventions provided to patient. Yes Patient provided with ticket for LDCT Scan. yes Written Order for Lung Cancer Screening with LDCT placed in Epic. Yes (CT Chest Lung Cancer Screening Low Dose W/O CM) [id number] Z12.2-Screening of respiratory organs Z87.891-Personal history of nicotine dependence   Josette Ranger, RN 08/04/24

## 2024-08-04 NOTE — Patient Instructions (Signed)

## 2024-08-06 NOTE — Progress Notes (Addendum)
 Counseled patient 4 minutes regarding tobacco use.

## 2024-08-12 ENCOUNTER — Ambulatory Visit (HOSPITAL_BASED_OUTPATIENT_CLINIC_OR_DEPARTMENT_OTHER)
Admission: RE | Admit: 2024-08-12 | Discharge: 2024-08-12 | Disposition: A | Discharge status: Home / Self Care | Source: Ambulatory Visit | Attending: Acute Care | Admitting: Acute Care

## 2024-08-12 DIAGNOSIS — F17210 Nicotine dependence, cigarettes, uncomplicated: Secondary | ICD-10-CM | POA: Insufficient documentation

## 2024-08-12 DIAGNOSIS — Z87891 Personal history of nicotine dependence: Secondary | ICD-10-CM | POA: Diagnosis not present

## 2024-08-12 DIAGNOSIS — Z122 Encounter for screening for malignant neoplasm of respiratory organs: Secondary | ICD-10-CM | POA: Insufficient documentation

## 2024-08-26 ENCOUNTER — Other Ambulatory Visit: Payer: Self-pay | Admitting: Acute Care

## 2024-08-26 DIAGNOSIS — Z122 Encounter for screening for malignant neoplasm of respiratory organs: Secondary | ICD-10-CM

## 2024-08-26 DIAGNOSIS — F17210 Nicotine dependence, cigarettes, uncomplicated: Secondary | ICD-10-CM

## 2024-08-26 DIAGNOSIS — Z87891 Personal history of nicotine dependence: Secondary | ICD-10-CM

## 2024-09-11 ENCOUNTER — Ambulatory Visit: Admitting: Family

## 2024-09-11 ENCOUNTER — Encounter: Payer: Self-pay | Admitting: Family

## 2024-09-11 ENCOUNTER — Ambulatory Visit: Payer: Self-pay

## 2024-09-11 VITALS — BP 136/58 | HR 72 | Temp 97.1°F | Ht 65.0 in | Wt 123.6 lb

## 2024-09-11 DIAGNOSIS — S90862A Insect bite (nonvenomous), left foot, initial encounter: Secondary | ICD-10-CM | POA: Diagnosis not present

## 2024-09-11 DIAGNOSIS — R2242 Localized swelling, mass and lump, left lower limb: Secondary | ICD-10-CM | POA: Diagnosis not present

## 2024-09-11 DIAGNOSIS — M79672 Pain in left foot: Secondary | ICD-10-CM | POA: Diagnosis not present

## 2024-09-11 DIAGNOSIS — W57XXXA Bitten or stung by nonvenomous insect and other nonvenomous arthropods, initial encounter: Secondary | ICD-10-CM

## 2024-09-11 MED ORDER — TRIAMCINOLONE ACETONIDE 0.5 % EX OINT: 1.0000 | TOPICAL_OINTMENT | Freq: Two times a day (BID) | CUTANEOUS | 0 refills | Status: AC

## 2024-09-11 MED ORDER — PREDNISONE 20 MG PO TABS: 40.0000 mg | ORAL_TABLET | Freq: Every day | ORAL | 0 refills | Status: AC

## 2024-09-11 NOTE — Telephone Encounter (Signed)
 FYI Only or Action Required?: FYI only for provider.  Patient was last seen in primary care on 07/15/2024 by Lavell Bari LABOR, FNP.  Called Nurse Triage reporting Insect Bite.  Symptoms began yesterday.  Interventions attempted: OTC medications: cortisone cream.  Symptoms are: gradually worsening.  Triage Disposition: See PCP When Office is Open (Within 3 Days)  Patient/caregiver understands and will follow disposition?: Yes   Copied from CRM [id number]. Topic: Clinical - Red Word Triage >> Sep 11, 2024  9:47 AM Sophia H wrote: Red Word that prompted transfer to Nurse Triage: Patient states she believes she was bit by something on her left foot. Redness & swelling, had light pain yesterday but only feels tight this morning. States definitely looks worse today than yesterday. Reason for Disposition  [1] SEVERE local itching (e.g., interferes with work, school, sleep) AND [2] not improved after 24 hours of hydrocortisone cream  Answer Assessment - Initial Assessment Questions 1. TYPE of INSECT: What type of insect was it?      unknown 2. ONSET: When did you get bitten?      yesterday 3. LOCATION: Where is the insect bite located?      Left foot 4. REDNESS: Is the area red or pink? If Yes, ask: What size is the area of redness? (inches or cm). When did the redness start?     Yes yesterday 5. PAIN: Is there any pain? If Yes, ask: How bad is the pain? (Scale 0-10; or none, mild, moderate, severe)     Mild  6. ITCHING: Does it itch? If Yes, ask: How bad is the itch?      moderate 7. SWELLING: How big is the swelling? (e.g., inches, cm, or compare to coins)     Mild and worsening 8. OTHER SYMPTOMS: Do you have any other symptoms?  (e.g., difficulty breathing, fever, hives)     no 9. PREGNANCY: Is there any chance you are pregnant? When was your last menstrual period?     Na  Applied cortisone cream for itching  Protocols used: Insect Bite-A-AH

## 2024-09-11 NOTE — Telephone Encounter (Signed)
 Pt has appt

## 2024-09-11 NOTE — Patient Instructions (Signed)
 Insect Bite, Adult  An insect bite can make your skin red, itchy, and swollen. An insect bite is different from an insect sting, which happens when an insect injects poison (venom) into the skin.  Some insects can spread disease to people through a bite. However, most insect bites do not lead to disease and are not serious.  What are the causes?  Insects may bite for a variety of reasons, including:  Hunger.  To defend themselves.  Insects that bite include:  Spiders.  Mosquitoes and flies.  Ticks and fleas.  Ants.  Kissing bugs.  Chiggers.  What are the signs or symptoms?  In many cases, symptoms last for 2-4 days. However, itching can last up to 10 days. Symptoms include:  Itching or pain in the bite area.  Redness and swelling in the bite area.  An open wound (skin ulcer).  In rare cases, a person may have a severe allergic reaction (anaphylactic reaction) to a bite. Symptoms of an anaphylactic reaction may include:  Feeling warm in the face (flushed). This may include redness.  Itchy, red, swollen areas of skin (hives).  Swelling of the eyes, lips, face, mouth, tongue, or throat.  Wheezing or difficulty breathing, speaking, or swallowing.  Dizziness, light-headedness, or fainting.  Abdominal symptoms like cramping, nausea, vomiting, or diarrhea.  How is this diagnosed?  This condition is usually diagnosed based on symptoms and a physical exam. During the exam, your health care provider will look at the bite and ask you what kind of insect bit you.  How is this treated?  Most insect bites are not serious. Symptoms often go away on their own and treatment is not usually needed. When treatment is recommended, it may include:  Applying ice to the affected area.  Applying steroid or other anti-itch creams, like calamine lotion, to the bite area.  Medicines called antihistamines to reduce itching.  You may also need:  A tetanus shot if you are not up to date.  Antibiotic cream or an oral antibiotic if the bite becomes  infected (this is uncommon).  Follow these instructions at home:  Bite area care    Do not scratch the bite area. It may help to cover the bite area with a bandage or close-fitting clothing.  Keep the bite area clean and dry. Wash it every day with soap and water as told by your health care provider.  Check the bite area every day for signs of infection. Check for:  More redness, swelling, or pain.  Fluid or blood.  Warmth.  Pus or a bad smell.  Managing pain, itching, and swelling    You may apply cortisone cream, calamine lotion, or a paste made of baking soda and water to the bite area as told by your health care provider.  If directed, put ice on the bite area. To do this:  Put ice in a plastic bag.  Place a towel between your skin and the bag.  Leave the ice on for 20 minutes, 2-3 times a day.  If your skin turns bright red, remove the ice right away to prevent skin damage. The risk of skin damage is higher if you cannot feel pain, heat, or cold.  General instructions  Apply or take over-the-counter and prescription medicine only as told by your health care provider.  If you were prescribed antibiotics, take or apply them as told by your health care provider. Do not stop using the antibiotic even if you start to feel  better.  How is this prevented?  To help reduce your risk of insect bites:  When you are outdoors, wear clothing that covers your arms and legs. This is especially important in the early morning and evening.  Use insect repellent. The best insect repellents contain DEET, picaridin, oil of lemon eucalyptus (OLE), or IR3535.  Consider spraying your clothing with a pesticide called permethrin. Permethrin helps prevent insect bites. It works for several weeks and for up to 5-6 clothing washes. Do not apply permethrin directly to the skin.  If your home windows do not have screens, consider installing them.  If you will be sleeping in an area where there are mosquitoes, consider covering your sleeping  area with a mosquito net.  Contact a health care provider if:  Your bite area has signs of infection, such as:  More redness, swelling, or pain.  Fluid or blood.  Warmth.  Pus or a bad smell.  You have a fever.  Get help right away if:  You have a rash.  You have muscle or joint pain.  You feel unusually tired or weak.  You have neck pain or a headache.  You develop symptoms of an anaphylactic reaction. These may include:  Swelling of the eyes, lips, face, mouth, tongue, or throat.  Flushed skin or hives.  Wheezing.  Difficulty breathing, speaking, or swallowing.  Dizziness, light-headedness, or fainting.  Abdominal pain, cramping, vomiting, or diarrhea.  These symptoms may be an emergency. Get help right away. Call 911.  Do not wait to see if the symptoms will go away.  Do not drive yourself to the hospital.  Summary  An insect bite can make your skin red, itchy, and swollen.  Treatment is usually not needed. Symptoms often go away on their own. When treatment is recommended, it may involve taking medicine, applying medicine to the area, or applying ice.  Apply or take over-the-counter and prescription medicines only as told by your health care provider.  Use insect repellent to help prevent insect bites.  Contact a health care provider if your bite area has signs of infection.  This information is not intended to replace advice given to you by your health care provider. Make sure you discuss any questions you have with your health care provider.  Document Revised: 03/22/2022 Document Reviewed: 03/07/2022  Elsevier Patient Education  2024 [employer].

## 2024-09-11 NOTE — Progress Notes (Signed)
 Subjective:    Patient ID: Brianna [name], female    DOB: 12-Nov-1951, 73 y.o.   MRN: [id number]  Chief Complaint  Patient presents with   Insect Bite    ON FOOT- PAIN AND SWELLING   HPI PT presents to the office today with left foot pain and swelling. Reports she felt something stick her yesterday. Didn't think much of it, but noticed increased redness and swelling.    Review of Systems  All other systems reviewed and are negative.   Social History   Socioeconomic History   Marital status: Married    Spouse name: Brianna [name]   Number of children: 2   Years of education: Not on file   Highest education level: Bachelor's degree (e.g., BA, AB, BS)  Occupational History   Occupation: Retired    Associate Professor: [employer]    Comment: [employer]  Tobacco Use   Smoking status: Heavy Smoker    Current packs/day: 1.50    Average packs/day: 1.5 packs/day for 46.0 years (69.0 ttl pk-yrs)    Types: Cigarettes   Smokeless tobacco: Never  Vaping Use   Vaping status: Never Used  Substance and Sexual Activity   Alcohol use: Yes    Comment: occasional   Drug use: No   Sexual activity: Not Currently  Other Topics Concern   Not on file  Social History Narrative   Brianna [name] is retired and lives with husband Brianna [name]. They have 2 children that they see regularly as well as grandchildren. She likes to read and spend time with the grandchildren. They have a dog that she walks multiple times per day for exercise.    Social Drivers of Corporate investment banker Strain: Low Risk  (04/16/2024)   Overall Financial Resource Strain (CARDIA)    Difficulty of Paying Living Expenses: Not hard at all  Food Insecurity: No Food Insecurity (04/16/2024)   Hunger Vital Sign    Worried About Running Out of Food in the Last Year: Never true    Ran Out of Food in the Last Year: Never true  Transportation Needs: No Transportation Needs (04/16/2024)   PRAPARE - Administrator, Civil Service (Medical): No     Lack of Transportation (Non-Medical): No  Physical Activity: Insufficiently Active (04/16/2024)   Exercise Vital Sign    Days of Exercise per Week: 3 days    Minutes of Exercise per Session: 20 min  Stress: No Stress Concern Present (04/16/2024)   [employer] of Occupational Health - Occupational Stress Questionnaire    Feeling of Stress : Not at all  Social Connections: Moderately Integrated (04/16/2024)   Social Connection and Isolation Panel    Frequency of Communication with Friends and Family: More than three times a week    Frequency of Social Gatherings with Friends and Family: More than three times a week    Attends Religious Services: 1 to 4 times per year    Active Member of [employer] or Organizations: No    Attends Banker Meetings: Never    Marital Status: Married   Family History  Problem Relation Age of Onset   Heart disease Mother    Osteoporosis Mother    Hypertension Mother    Cancer Father        lung   Diabetes Father    Cancer Sister        brain   Diabetes Sister    Osteoporosis Sister    Breast cancer Sister  Cancer Sister        breast   Cancer Brother        brain   Diabetes Brother    Diabetes Brother         Objective:   Physical Exam Vitals reviewed.  Constitutional:      General: She is not in acute distress.    Appearance: She is well-developed.  HENT:     Head: Normocephalic and atraumatic.  Eyes:     Pupils: Pupils are equal, round, and reactive to light.  Neck:     Thyroid : No thyromegaly.  Cardiovascular:     Rate and Rhythm: Normal rate and regular rhythm.     Heart sounds: Normal heart sounds. No murmur heard. Pulmonary:     Effort: Pulmonary effort is normal. No respiratory distress.     Breath sounds: Normal breath sounds. No wheezing.  Abdominal:     General: Bowel sounds are normal. There is no distension.     Palpations: Abdomen is soft.     Tenderness: There is no abdominal tenderness.   Musculoskeletal:        General: No tenderness. Normal range of motion.     Cervical back: Normal range of motion and neck supple.     Left lower leg: Edema (2+ swelling in left foot) present.       Feet:  Feet:     Comments: Two swell erythemas areas on left lateral foot, with erythemas extending to dorsum foot, 2+ swelling.  Skin:    General: Skin is warm and dry.     Findings: Erythema present.  Neurological:     Mental Status: She is alert and oriented to person, place, and time.     Cranial Nerves: No cranial nerve deficit.     Deep Tendon Reflexes: Reflexes are normal and symmetric.  Psychiatric:        Behavior: Behavior normal.        Thought Content: Thought content normal.        Judgment: Judgment normal.       BP (!) 136/58   Pulse 72   Temp (!) 97.1 F (36.2 C)   Ht 5' 5 (1.651 m)   Wt 123 lb 9.6 oz (56.1 kg)   SpO2 97%   BMI 20.57 kg/m      Assessment & Plan:  Brianna [name] comes in today with chief complaint of Insect Bite (ON FOOT- PAIN AND SWELLING)   Diagnosis and orders addressed:  1. Localized swelling of left foot (Primary)  2. Left foot pain  3. Insect bite of left foot, initial encounter Start prednisone   Kenalog  cream BID  Can soak foot Elevated food  Report any increased in redness Follow up if symptoms worsen or do not improve  - triamcinolone  ointment (KENALOG ) 0.5 %; Apply 1 Application topically 2 (two) times daily.  Dispense: 30 g; Refill: 0 - predniSONE  (DELTASONE ) 20 MG tablet; Take 2 tablets (40 mg total) by mouth daily with breakfast for 5 days.  Dispense: 10 tablet; Refill: 0     Bari Learn, FNP

## 2024-10-30 ENCOUNTER — Other Ambulatory Visit: Payer: Self-pay | Admitting: *Deleted

## 2024-10-30 DIAGNOSIS — J449 Chronic obstructive pulmonary disease, unspecified: Secondary | ICD-10-CM

## 2024-11-25 ENCOUNTER — Encounter: Payer: Self-pay | Admitting: *Deleted

## 2024-11-25 NOTE — Progress Notes (Signed)
 Brianna [name]                                          MRN: [id number]   11/25/2024   The [facility] Quality Team Specialist reviewed this patient medical record for the purposes of chart review for care gap closure. The following were reviewed: abstraction for care gap closure-colorectal cancer screening.    [facility] Quality Team

## 2025-01-15 ENCOUNTER — Ambulatory Visit (INDEPENDENT_AMBULATORY_CARE_PROVIDER_SITE_OTHER): Payer: Self-pay | Admitting: Family

## 2025-01-15 ENCOUNTER — Encounter: Payer: Self-pay | Admitting: Family

## 2025-01-15 VITALS — BP 124/61 | HR 61 | Temp 97.6°F | Ht 65.0 in | Wt 120.8 lb

## 2025-01-15 DIAGNOSIS — Z0000 Encounter for general adult medical examination without abnormal findings: Secondary | ICD-10-CM | POA: Diagnosis not present

## 2025-01-15 DIAGNOSIS — J449 Chronic obstructive pulmonary disease, unspecified: Secondary | ICD-10-CM | POA: Diagnosis not present

## 2025-01-15 DIAGNOSIS — E785 Hyperlipidemia, unspecified: Secondary | ICD-10-CM | POA: Diagnosis not present

## 2025-01-15 DIAGNOSIS — I700 Atherosclerosis of aorta: Secondary | ICD-10-CM | POA: Diagnosis not present

## 2025-01-15 DIAGNOSIS — F17200 Nicotine dependence, unspecified, uncomplicated: Secondary | ICD-10-CM | POA: Diagnosis not present

## 2025-01-15 DIAGNOSIS — E663 Overweight: Secondary | ICD-10-CM | POA: Diagnosis not present

## 2025-01-15 DIAGNOSIS — I10 Essential (primary) hypertension: Secondary | ICD-10-CM | POA: Diagnosis not present

## 2025-01-15 DIAGNOSIS — Z23 Encounter for immunization: Secondary | ICD-10-CM

## 2025-01-15 DIAGNOSIS — M8580 Other specified disorders of bone density and structure, unspecified site: Secondary | ICD-10-CM | POA: Diagnosis not present

## 2025-01-15 LAB — CBC WITH DIFFERENTIAL/PLATELET

## 2025-01-15 NOTE — Progress Notes (Signed)
 "  Subjective:    Patient ID: Brianna [name], female    DOB: [birth year], 74 y.o.   MRN: [id number]  Chief Complaint  Patient presents with   Medical Management of Chronic Issues    6 mos   Pt presents to the office today for CPE.   She had Moh surgery on 02/24.    She has COPD and she had decreased to 1/2  pack a day. States she has intermittent SOB with exertion. She uses Spiriva  daily and albuterol  as needed.    She osteopenia and takes calcium  and vit d daily. She walks most days for 30 mins a day. Last Dexa scan 05/25/23.   Pt has Aortic atherosclerosis and takes Lipitor daily.  Hypertension This is a chronic problem. The current episode started more than 1 year ago. The problem has been resolved since onset. The problem is controlled. Associated symptoms include malaise/fatigue and shortness of breath. Pertinent negatives include no peripheral edema. Risk factors for coronary artery disease include dyslipidemia, sedentary lifestyle, smoking/tobacco exposure and post-menopausal state. The current treatment provides moderate improvement.  Hyperlipidemia This is a chronic problem. The current episode started more than 1 year ago. The problem is controlled. Recent lipid tests were reviewed and are normal. Factors aggravating her hyperlipidemia include smoking. Associated symptoms include shortness of breath. Current antihyperlipidemic treatment includes statins. The current treatment provides moderate improvement of lipids. Risk factors for coronary artery disease include dyslipidemia, hypertension, a sedentary lifestyle and post-menopausal.  Nicotine Dependence Presents for follow-up visit. She smokes < 1/2 a pack of cigarettes per day.      Review of Systems  Constitutional:  Positive for malaise/fatigue.  Respiratory:  Positive for shortness of breath.   All other systems reviewed and are negative.  Family History  Problem Relation Age of Onset   Heart disease Mother    Osteoporosis  Mother    Hypertension Mother    Cancer Father        lung   Diabetes Father    Cancer Sister        brain   Diabetes Sister    Osteoporosis Sister    Breast cancer Sister    Cancer Sister        breast   Cancer Brother        brain   Diabetes Brother    Diabetes Brother    Social History   Socioeconomic History   Marital status: Married    Spouse name: [name]   Number of children: 2   Years of education: Not on file   Highest education level: Bachelor's degree (e.g., BA, AB, BS)  Occupational History   Occupation: Retired    Associate Professor: [employer]    Comment: [employer]  Tobacco Use   Smoking status: Heavy Smoker    Current packs/day: 1.50    Average packs/day: 1.5 packs/day for 46.0 years (69.0 ttl pk-yrs)    Types: Cigarettes   Smokeless tobacco: Never  Vaping Use   Vaping status: Never Used  Substance and Sexual Activity   Alcohol use: Yes    Comment: occasional   Drug use: No   Sexual activity: Not Currently  Other Topics Concern   Not on file  Social History Narrative   Hitomi is retired and lives with husband [name]. They have 2 children that they see regularly as well as grandchildren. She likes to read and spend time with the grandchildren. They have a dog that she walks multiple times per day  for exercise.    Social Drivers of Health   Tobacco Use: High Risk (01/15/2025)   Patient History    Smoking Tobacco Use: Heavy Smoker    Smokeless Tobacco Use: Never    Passive Exposure: Not on file  Financial Resource Strain: Low Risk (04/16/2024)   Overall Financial Resource Strain (CARDIA)    Difficulty of Paying Living Expenses: Not hard at all  Food Insecurity: No Food Insecurity (04/16/2024)   Hunger Vital Sign    Worried About Running Out of Food in the Last Year: Never true    Ran Out of Food in the Last Year: Never true  Transportation Needs: No Transportation Needs (04/16/2024)   PRAPARE - Administrator, Civil Service (Medical): No     Lack of Transportation (Non-Medical): No  Physical Activity: Insufficiently Active (04/16/2024)   Exercise Vital Sign    Days of Exercise per Week: 3 days    Minutes of Exercise per Session: 20 min  Stress: No Stress Concern Present (04/16/2024)   [employer] of Occupational Health - Occupational Stress Questionnaire    Feeling of Stress : Not at all  Social Connections: Moderately Integrated (04/16/2024)   Social Connection and Isolation Panel    Frequency of Communication with Friends and Family: More than three times a week    Frequency of Social Gatherings with Friends and Family: More than three times a week    Attends Religious Services: 1 to 4 times per year    Active Member of Clubs or Organizations: No    Attends Banker Meetings: Never    Marital Status: Married  Depression (PHQ2-9): Low Risk (01/15/2025)   Depression (PHQ2-9)    PHQ-2 Score: 0  Alcohol Screen: Low Risk (04/16/2024)   Alcohol Screen    Last Alcohol Screening Score (AUDIT): 0  Housing: Unknown (04/16/2024)   Housing Stability Vital Sign    Unable to Pay for Housing in the Last Year: No    Number of Times Moved in the Last Year: Not on file    Homeless in the Last Year: No  Utilities: Not At Risk (04/16/2024)   [facility] Utilities    Threatened with loss of utilities: No  Health Literacy: Adequate Health Literacy (04/16/2024)   B1300 Health Literacy    Frequency of need for help with medical instructions: Never       Objective:   Physical Exam Vitals reviewed.  Constitutional:      General: She is not in acute distress.    Appearance: She is well-developed.  HENT:     Head: Normocephalic and atraumatic.     Right Ear: Tympanic membrane normal.     Left Ear: Tympanic membrane normal.  Eyes:     Pupils: Pupils are equal, round, and reactive to light.  Neck:     Thyroid : No thyromegaly.  Cardiovascular:     Rate and Rhythm: Normal rate and regular rhythm.     Heart sounds: Normal heart  sounds. No murmur heard. Pulmonary:     Effort: Pulmonary effort is normal. No respiratory distress.     Breath sounds: Rhonchi present. No wheezing.  Abdominal:     General: Bowel sounds are normal. There is no distension.     Palpations: Abdomen is soft.     Tenderness: There is no abdominal tenderness.  Musculoskeletal:        General: No tenderness. Normal range of motion.     Cervical back: Normal range of motion and  neck supple.  Skin:    General: Skin is warm and dry.  Neurological:     Mental Status: She is alert and oriented to person, place, and time.     Cranial Nerves: No cranial nerve deficit.     Deep Tendon Reflexes: Reflexes are normal and symmetric.  Psychiatric:        Behavior: Behavior normal.        Thought Content: Thought content normal.        Judgment: Judgment normal.        BP 124/61   Pulse 61   Temp 97.6 F (36.4 C) (Oral)   Ht 5' 5 (1.651 m)   Wt 120 lb 12.8 oz (54.8 kg)   SpO2 99%   BMI 20.10 kg/m   Assessment & Plan:  Marijose Curington comes in today with chief complaint of Medical Management of Chronic Issues (6 mos)   Diagnosis and orders addressed:  1. Annual physical exam (Primary) - CMP14+EGFR - CBC with Differential/Platelet - Lipid panel - TSH  2. Aortic atherosclerosis - CMP14+EGFR - CBC with Differential/Platelet - Lipid panel  3. Chronic obstructive pulmonary disease, unspecified COPD type (HCC) - CMP14+EGFR - CBC with Differential/Platelet  4. Hyperlipidemia, unspecified hyperlipidemia type - CMP14+EGFR - CBC with Differential/Platelet - Lipid panel  5. Tobacco use disorder - CMP14+EGFR - CBC with Differential/Platelet  6. Overweight (BMI 25.0-29.9)  - CMP14+EGFR - CBC with Differential/Platelet  7. Osteopenia with high risk of fracture  - CMP14+EGFR - CBC with Differential/Platelet  8. Hypertension, unspecified type - CMP14+EGFR - CBC with Differential/Platelet - TSH  Labs pending Continue current  medications- TDAP given today Health Maintenance reviewed Diet and exercise encouraged  Follow up plan: 6 months    Bari Learn, FNP   "

## 2025-01-15 NOTE — Patient Instructions (Signed)
 Health Maintenance After Age 75 After age 27, you are at a higher risk for certain long-term diseases and infections as well as injuries from falls. Falls are a major cause of broken bones and head injuries in people who are older than age 73. Getting regular preventive care can help to keep you healthy and well. Preventive care includes getting regular testing and making lifestyle changes as recommended by your health care provider. Talk with your health care provider about: Which screenings and tests you should have. A screening is a test that checks for a disease when you have no symptoms. A diet and exercise plan that is right for you. What should I know about screenings and tests to prevent falls? Screening and testing are the best ways to find a health problem early. Early diagnosis and treatment give you the best chance of managing medical conditions that are common after age 90. Certain conditions and lifestyle choices may make you more likely to have a fall. Your health care provider may recommend: Regular vision checks. Poor vision and conditions such as cataracts can make you more likely to have a fall. If you wear glasses, make sure to get your prescription updated if your vision changes. Medicine review. Work with your health care provider to regularly review all of the medicines you are taking, including over-the-counter medicines. Ask your health care provider about any side effects that may make you more likely to have a fall. Tell your health care provider if any medicines that you take make you feel dizzy or sleepy. Strength and balance checks. Your health care provider may recommend certain tests to check your strength and balance while standing, walking, or changing positions. Foot health exam. Foot pain and numbness, as well as not wearing proper footwear, can make you more likely to have a fall. Screenings, including: Osteoporosis screening. Osteoporosis is a condition that causes  the bones to get weaker and break more easily. Blood pressure screening. Blood pressure changes and medicines to control blood pressure can make you feel dizzy. Depression screening. You may be more likely to have a fall if you have a fear of falling, feel depressed, or feel unable to do activities that you used to do. Alcohol  use screening. Using too much alcohol  can affect your balance and may make you more likely to have a fall. Follow these instructions at home: Lifestyle Do not drink alcohol  if: Your health care provider tells you not to drink. If you drink alcohol : Limit how much you have to: 0-1 drink a day for women. 0-2 drinks a day for men. Know how much alcohol  is in your drink. In the [place] one drink equals one 12 oz bottle of beer (355 mL), one 5 oz glass of wine (148 mL), or one 1 oz glass of hard liquor (44 mL). Do not use any products that contain nicotine or tobacco. These products include cigarettes, chewing tobacco, and vaping devices, such as e-cigarettes. If you need help quitting, ask your health care provider. Activity  Follow a regular exercise program to stay fit. This will help you maintain your balance. Ask your health care provider what types of exercise are appropriate for you. If you need a cane or walker, use it as recommended by your health care provider. Wear supportive shoes that have nonskid soles. Safety  Remove any tripping hazards, such as rugs, cords, and clutter. Install safety equipment such as grab bars in bathrooms and safety rails on stairs. Keep rooms and walkways  well-lit. General instructions Talk with your health care provider about your risks for falling. Tell your health care provider if: You fall. Be sure to tell your health care provider about all falls, even ones that seem minor. You feel dizzy, tiredness (fatigue), or off-balance. Take over-the-counter and prescription medicines only as told by your health care provider. These include  supplements. Eat a healthy diet and maintain a healthy weight. A healthy diet includes low-fat dairy products, low-fat (lean) meats, and fiber from whole grains, beans, and lots of fruits and vegetables. Stay current with your vaccines. Schedule regular health, dental, and eye exams. Summary Having a healthy lifestyle and getting preventive care can help to protect your health and wellness after age 15. Screening and testing are the best way to find a health problem early and help you avoid having a fall. Early diagnosis and treatment give you the best chance for managing medical conditions that are more common for people who are older than age 42. Falls are a major cause of broken bones and head injuries in people who are older than age 64. Take precautions to prevent a fall at home. Work with your health care provider to learn what changes you can make to improve your health and wellness and to prevent falls. This information is not intended to replace advice given to you by your health care provider. Make sure you discuss any questions you have with your health care provider. Document Revised: 05/02/2021 Document Reviewed: 05/02/2021 [employer] Patient Education  2024 [employer].

## 2025-01-16 ENCOUNTER — Ambulatory Visit: Payer: Self-pay | Admitting: Family

## 2025-01-16 LAB — TSH: TSH: 1.18 u[IU]/mL (ref 0.450–4.500)

## 2025-01-16 LAB — CMP14+EGFR
ALT: 17 IU/L (ref 0–32)
AST: 16 IU/L (ref 0–40)
Albumin: 4.3 g/dL (ref 3.8–4.8)
Alkaline Phosphatase: 85 IU/L (ref 49–135)
BUN/Creatinine Ratio: 21 (ref 12–28)
BUN: 15 mg/dL (ref 8–27)
Bilirubin Total: 0.7 mg/dL (ref 0.0–1.2)
CO2: 24 mmol/L (ref 20–29)
Calcium: 9.9 mg/dL (ref 8.7–10.3)
Chloride: 99 mmol/L (ref 96–106)
Creatinine, Ser: 0.73 mg/dL (ref 0.57–1.00)
Globulin, Total: 2 g/dL (ref 1.5–4.5)
Glucose: 83 mg/dL (ref 70–99)
Potassium: 4.5 mmol/L (ref 3.5–5.2)
Sodium: 135 mmol/L (ref 134–144)
Total Protein: 6.3 g/dL (ref 6.0–8.5)
eGFR: 86 mL/min/1.73

## 2025-01-16 LAB — CBC WITH DIFFERENTIAL/PLATELET
Basos: 1 %
EOS (ABSOLUTE): 0.1 x10E3/uL (ref 0.0–0.2)
Eos: 2 %
Hematocrit: 44.2 % (ref 34.0–46.6)
Hemoglobin: 14.4 g/dL (ref 11.1–15.9)
Immature Granulocytes: 0 %
Immature Granulocytes: 0 x10E3/uL (ref 0.0–0.1)
Lymphs: 48 %
MCH: 31.7 pg (ref 26.6–33.0)
MCHC: 32.6 g/dL (ref 31.5–35.7)
MCV: 97 fL (ref 79–97)
Monocytes Absolute: 0.2 x10E3/uL (ref 0.0–0.4)
Monocytes Absolute: 0.5 x10E3/uL (ref 0.1–0.9)
Monocytes: 6 %
Neutrophils Absolute: 3.5 x10E3/uL (ref 1.4–7.0)
Neutrophils Absolute: 3.9 x10E3/uL — AB (ref 0.7–3.1)
Neutrophils: 43 %
Platelets: 282 x10E3/uL (ref 150–450)
RBC: 4.54 x10E6/uL (ref 3.77–5.28)
RDW: 12.1 % (ref 11.7–15.4)
WBC: 8.1 x10E3/uL (ref 3.4–10.8)

## 2025-01-16 LAB — LIPID PANEL
Chol/HDL Ratio: 1.8 ratio (ref 0.0–4.4)
Cholesterol, Total: 148 mg/dL (ref 100–199)
HDL: 84 mg/dL
LDL Chol Calc (NIH): 52 mg/dL (ref 0–99)
Triglycerides: 60 mg/dL (ref 0–149)
VLDL Cholesterol Cal: 12 mg/dL (ref 5–40)

## 2025-01-28 ENCOUNTER — Other Ambulatory Visit: Payer: Self-pay | Admitting: Family

## 2025-01-28 DIAGNOSIS — J449 Chronic obstructive pulmonary disease, unspecified: Secondary | ICD-10-CM
